# Patient Record
Sex: Female | Born: 1985 | Race: Black or African American | Hispanic: No | Marital: Single | Smoking: Current every day smoker
Health system: Southern US, Community
[De-identification: ages and names within clinical notes are randomized; demographics above are authoritative.]

## PROBLEM LIST (undated history)

## (undated) DIAGNOSIS — F319 Bipolar disorder, unspecified: Secondary | ICD-10-CM

## (undated) DIAGNOSIS — J45909 Unspecified asthma, uncomplicated: Secondary | ICD-10-CM

## (undated) DIAGNOSIS — T7840XA Allergy, unspecified, initial encounter: Secondary | ICD-10-CM

## (undated) HISTORY — DX: Allergy, unspecified, initial encounter: T78.40XA

## (undated) HISTORY — PX: DILATION AND CURETTAGE OF UTERUS: SHX78

## (undated) HISTORY — DX: Bipolar disorder, unspecified: F31.9

---

## 2009-08-27 ENCOUNTER — Emergency Department (HOSPITAL_COMMUNITY): Admission: EM | Admit: 2009-08-27 | Discharge: 2009-08-29 | Payer: Self-pay | Admitting: Emergency Medicine

## 2009-08-29 ENCOUNTER — Ambulatory Visit: Payer: Self-pay | Admitting: Psychiatry

## 2009-08-29 ENCOUNTER — Inpatient Hospital Stay (HOSPITAL_COMMUNITY): Admission: AD | Admit: 2009-08-29 | Discharge: 2009-09-02 | Payer: Self-pay | Admitting: Psychiatry

## 2009-10-23 ENCOUNTER — Emergency Department (HOSPITAL_COMMUNITY): Admission: EM | Admit: 2009-10-23 | Discharge: 2009-10-23 | Payer: Self-pay | Admitting: Emergency Medicine

## 2009-11-08 ENCOUNTER — Emergency Department (HOSPITAL_COMMUNITY): Admission: EM | Admit: 2009-11-08 | Discharge: 2009-11-08 | Payer: Self-pay | Admitting: Family Medicine

## 2010-08-20 IMAGING — CT CT HEAD W/O CM
1 series · 16 of 30 positions shown, 20 images · non-contrast
Comparison: None

CLINICAL DATA: Altered/ inappropriate behavior patterns increasing
over the past several months.

CT HEAD WITHOUT CONTRAST
TECHNIQUE: Contiguous axial images were obtained from the base of
the skull through the vertex without contrast.

[Series 2: headseq 4.8 h45s · axial · 0.43mm/px · z∈[-182,-53]mm · 16 of 30 slices shown, 20 images]
[im 2/30  brain]
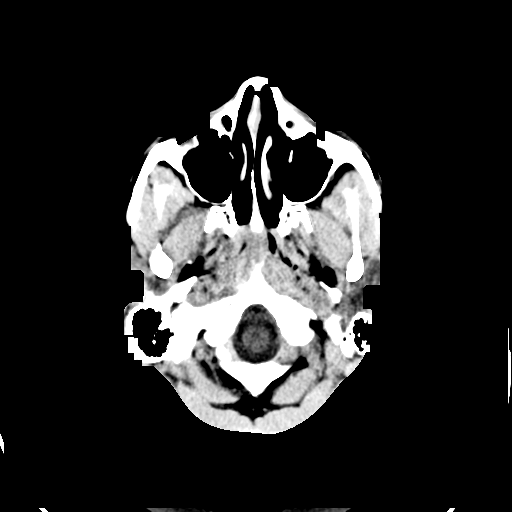
[im 2/30  bone]
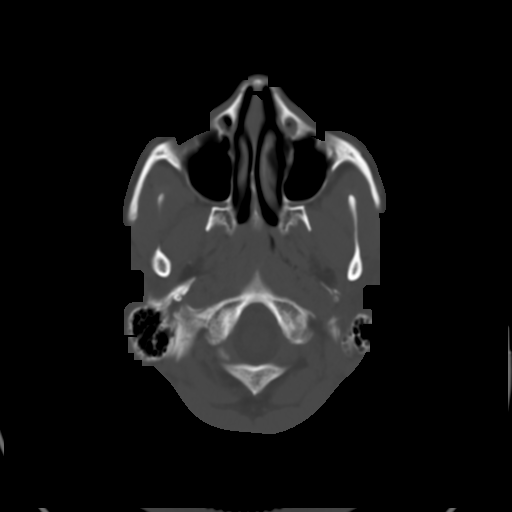
[im 4/30  brain]
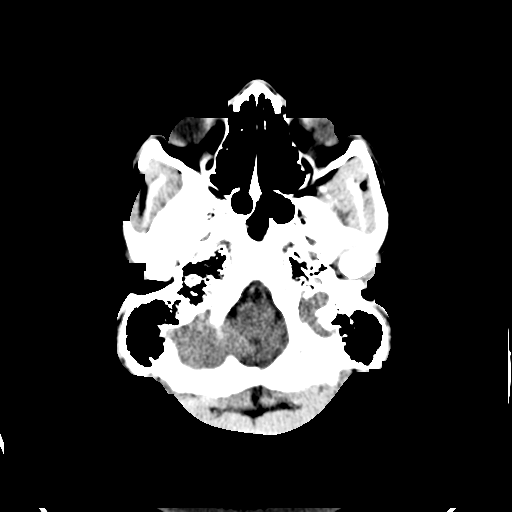
[im 6/30  brain]
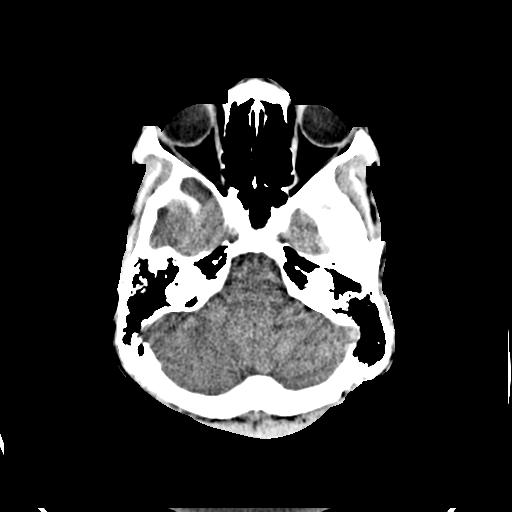
[im 8/30  brain]
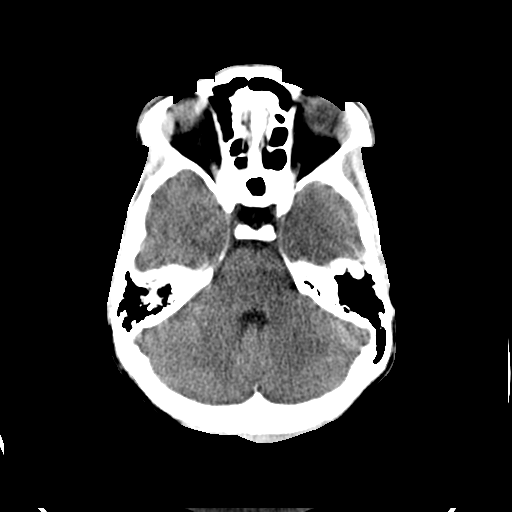
[im 9/30  brain]
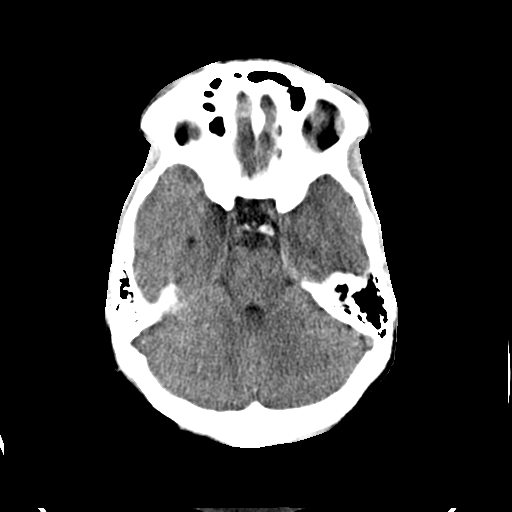
[im 9/30  bone]
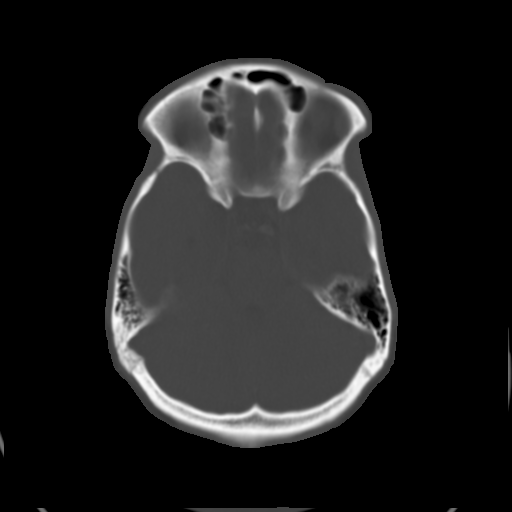
[im 11/30  brain]
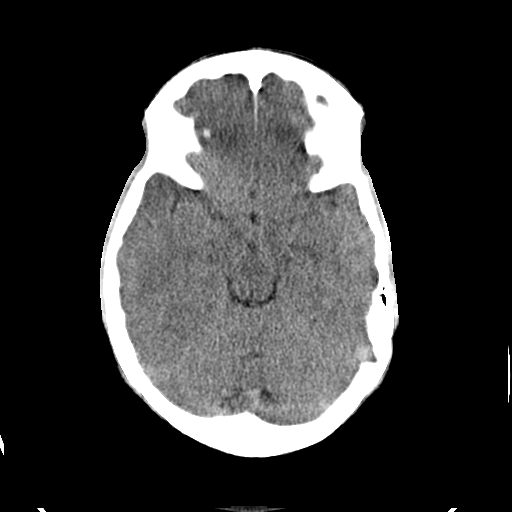
[im 13/30  brain]
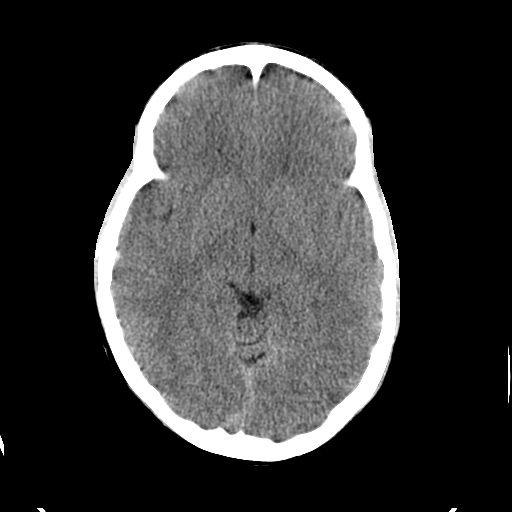
[im 15/30  brain]
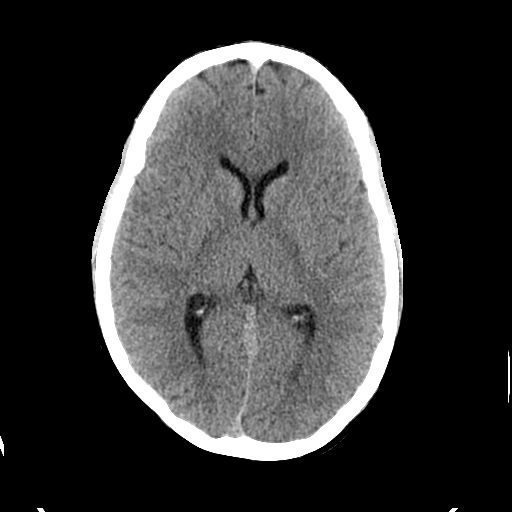
[im 16/30  brain]
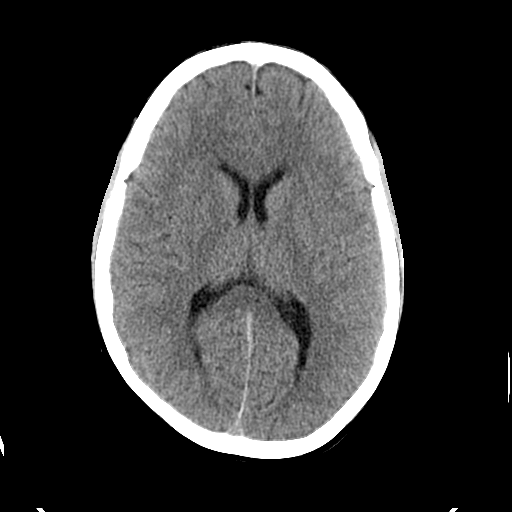
[im 16/30  bone]
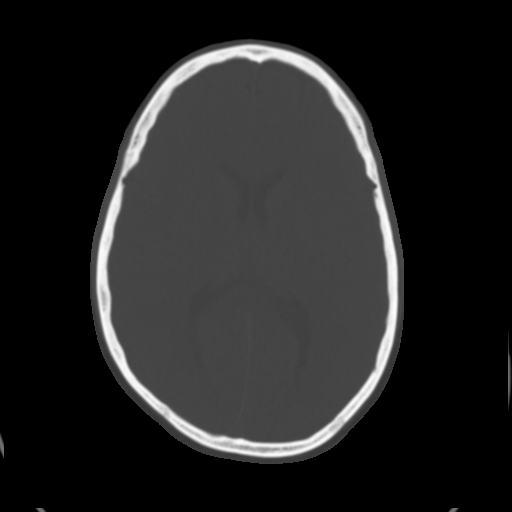
[im 18/30  brain]
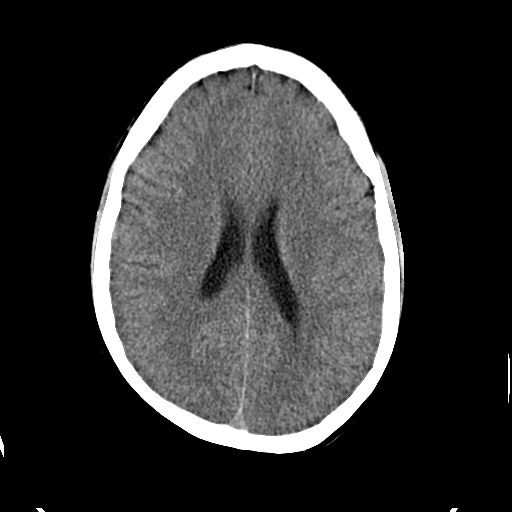
[im 20/30  brain]
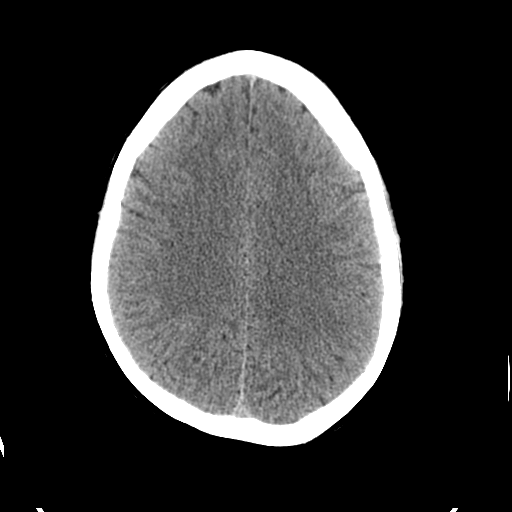
[im 22/30  brain]
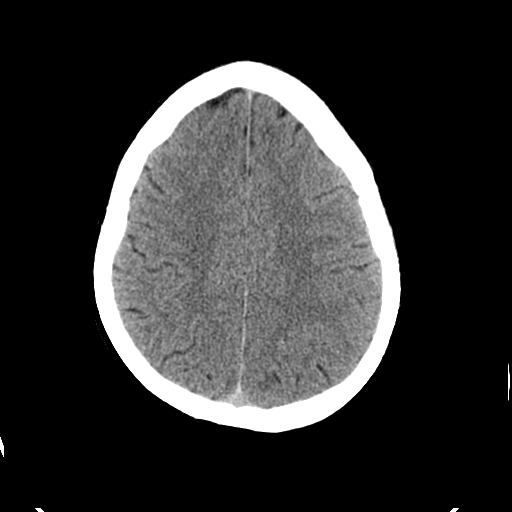
[im 23/30  brain]
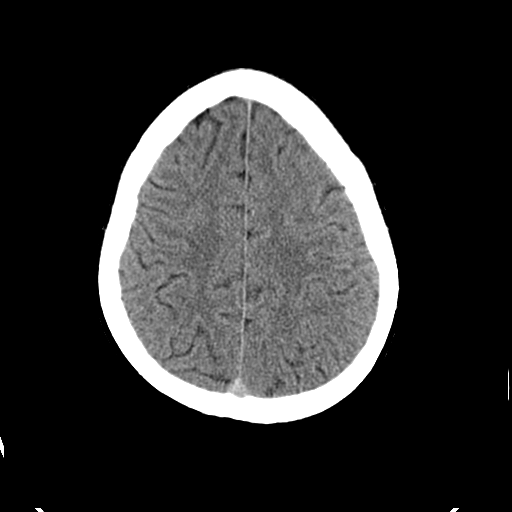
[im 23/30  bone]
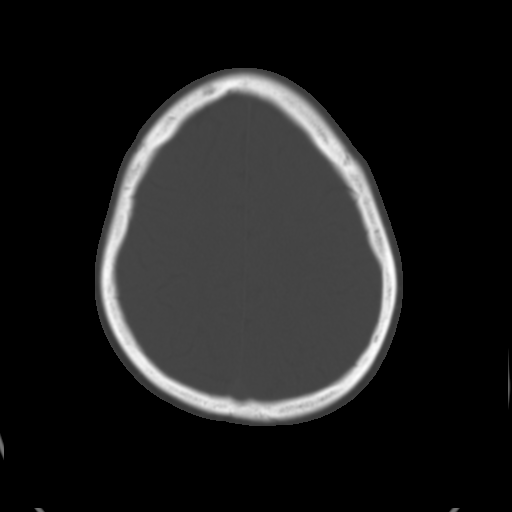
[im 25/30  brain]
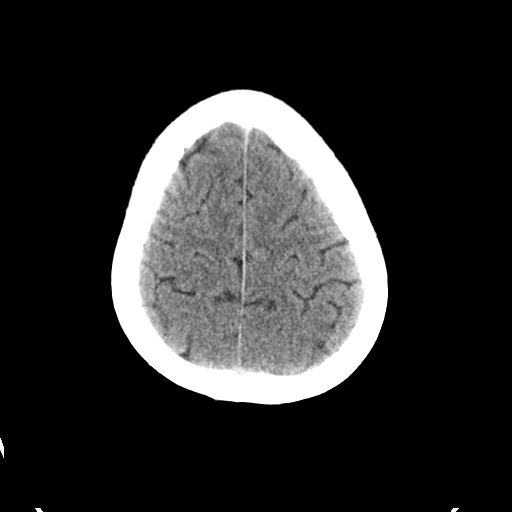
[im 27/30  brain]
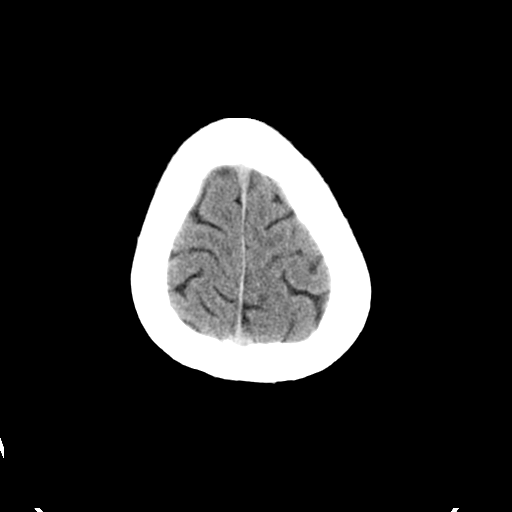
[im 29/30  brain]
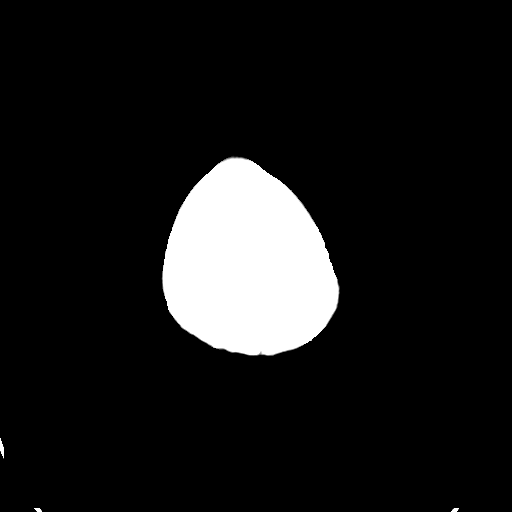

[16 of 30 positions shown; findings below may reference images not displayed]

FINDINGS: There is no intra or extra-axial fluid collection or mass
lesion.  The basilar cisterns and ventricles have a normal
appearance.  There is no CT evidence for acute infarction or
hemorrhage.

Bone windows show no acute findings
IMPRESSION: No evidence for acute intracranial abnormality.

## 2011-03-12 LAB — URINE CULTURE
Colony Count: NO GROWTH
Culture: NO GROWTH

## 2011-03-12 LAB — POCT URINALYSIS DIP (DEVICE)
Bilirubin Urine: NEGATIVE
Hgb urine dipstick: NEGATIVE
Ketones, ur: NEGATIVE mg/dL
Protein, ur: NEGATIVE mg/dL
pH: 7 (ref 5.0–8.0)

## 2011-03-12 LAB — GC/CHLAMYDIA PROBE AMP, GENITAL
Chlamydia, DNA Probe: NEGATIVE
GC Probe Amp, Genital: NEGATIVE

## 2011-03-12 LAB — WET PREP, GENITAL: Yeast Wet Prep HPF POC: NONE SEEN

## 2011-03-13 LAB — POCT URINALYSIS DIP (DEVICE)
Hgb urine dipstick: NEGATIVE
Protein, ur: NEGATIVE mg/dL
Specific Gravity, Urine: 1.02 (ref 1.005–1.030)
Urobilinogen, UA: 1 mg/dL (ref 0.0–1.0)
pH: 7 (ref 5.0–8.0)

## 2011-03-15 LAB — URINE MICROSCOPIC-ADD ON

## 2011-03-15 LAB — COMPREHENSIVE METABOLIC PANEL
AST: 18 U/L (ref 0–37)
Albumin: 3.9 g/dL (ref 3.5–5.2)
Alkaline Phosphatase: 57 U/L (ref 39–117)
BUN: 7 mg/dL (ref 6–23)
CO2: 27 mEq/L (ref 19–32)
Chloride: 107 mEq/L (ref 96–112)
GFR calc Af Amer: >60 mL/min (ref >60)
Potassium: 3.5 mEq/L (ref 3.5–5.1)
Total Bilirubin: 0.7 mg/dL (ref 0.3–1.2)

## 2011-03-15 LAB — DIFFERENTIAL
Basophils Absolute: 0 10*3/uL (ref 0.0–0.1)
Basophils Relative: 0 % (ref 0–1)
Eosinophils Relative: 1 % (ref 0–5)
Monocytes Absolute: 0.6 10*3/uL (ref 0.1–1.0)

## 2011-03-15 LAB — URINALYSIS, ROUTINE W REFLEX MICROSCOPIC
Glucose, UA: NEGATIVE mg/dL
Hgb urine dipstick: NEGATIVE
Specific Gravity, Urine: 1.025 (ref 1.005–1.030)
Urobilinogen, UA: 1 mg/dL (ref 0.0–1.0)
pH: 6 (ref 5.0–8.0)

## 2011-03-15 LAB — RAPID URINE DRUG SCREEN, HOSP PERFORMED: Benzodiazepines: NOT DETECTED

## 2011-03-15 LAB — CBC
HCT: 41.5 % (ref 36.0–46.0)
Platelets: 234 10*3/uL (ref 150–400)
RBC: 4.73 MIL/uL (ref 3.87–5.11)
WBC: 7.1 10*3/uL (ref 4.0–10.5)

## 2011-03-15 LAB — ETHANOL: Alcohol, Ethyl (B): <5 mg/dL (ref 0–10)

## 2013-06-22 ENCOUNTER — Ambulatory Visit (INDEPENDENT_AMBULATORY_CARE_PROVIDER_SITE_OTHER): Payer: BC Managed Care – PPO | Admitting: Internal Medicine

## 2013-06-22 VITALS — BP 112/70 | HR 71 | Temp 97.8°F | Resp 18 | Ht 61.5 in | Wt 91.0 lb

## 2013-06-22 DIAGNOSIS — A599 Trichomoniasis, unspecified: Secondary | ICD-10-CM

## 2013-06-22 DIAGNOSIS — Z0000 Encounter for general adult medical examination without abnormal findings: Secondary | ICD-10-CM

## 2013-06-22 DIAGNOSIS — Z309 Encounter for contraceptive management, unspecified: Secondary | ICD-10-CM

## 2013-06-22 DIAGNOSIS — Z2089 Contact with and (suspected) exposure to other communicable diseases: Secondary | ICD-10-CM

## 2013-06-22 DIAGNOSIS — Z202 Contact with and (suspected) exposure to infections with a predominantly sexual mode of transmission: Secondary | ICD-10-CM

## 2013-06-22 LAB — POCT UA - MICROSCOPIC ONLY
Bacteria, U Microscopic: NEGATIVE
Casts, Ur, LPF, POC: NEGATIVE
Yeast, UA: NEGATIVE

## 2013-06-22 LAB — POCT URINALYSIS DIPSTICK
Bilirubin, UA: NEGATIVE
Blood, UA: NEGATIVE
Ketones, UA: NEGATIVE
Spec Grav, UA: 1.02
pH, UA: 8

## 2013-06-22 LAB — POCT WET PREP WITH KOH: Trichomonas, UA: POSITIVE

## 2013-06-22 MED ORDER — METRONIDAZOLE 500 MG PO TABS: 500.0000 mg | ORAL_TABLET | Freq: Two times a day (BID) | ORAL | Status: DC

## 2013-06-22 MED ORDER — ETONOGESTREL-ETHINYL ESTRADIOL 0.12-0.015 MG/24HR VA RING: VAGINAL_RING | VAGINAL | Status: DC

## 2013-06-22 NOTE — Progress Notes (Signed)
Subjective:    Patient ID: Melinda Baker, female    DOB: [birth year], 27 y.o.   MRN: [id number]  HPI Melinda Baker is a 27 y.o. female presenting for CPE.    Her main concern today is  STD testing.  She received a phone call from a previous sexual partner that after having a sexual encounter around [place], he had to be treated for 7 days with an anti-biotics.  She has had 5 partners since then.  Intermittent use of condoms.  Describes vaginal d/c being "creamier" but not a change in volume.  Had a cyst several months ago in her groin area but it went away on it's own.  Denies dysuria, urinary frequency.  Hx of D&C.  She is now interested in birth control.  Has used pill and nuvaring in the past with no complications.    She was diagnosed with Bipolar Disorder 3-4 years ago and placed in rehab.  She took a medication for two years.  Did not feel like herself.  Self-discontinued the medication.  Has not had any episodes since stopping the medication.  She describes her mood as normal.  Mentioned she occasionally gets upset when someone "disrespects" her.    She has a history of drug use - ectasy, marijuana.  No drug use at this point.  Denies alcohol use.  Smokes 0.5 pack a day.  Started using e-cig and hopes that will help her quit.   Past medical history, surgical history, medications, allergies, social history and family history have been reviewed.  Review of Systems As stated in HPI - otherwise negative.    Objective:   Physical Exam Filed Vitals:   06/22/13 1439  BP: 112/70  Pulse: 71  Temp: 97.8 F (36.6 C)  TempSrc: Oral  Resp: 18  Height: 5' 1.5" (1.562 m)  Weight: 91 lb (41.277 kg)  SpO2: 100%   General:  WDWN female in no acute distress. Skin:  Soft, warm skin with good turgor.  No bruising or lesions present.  HEENT:   Head - normocephalic, no visible or palpable masses.  Eyes - conjunctivae clear, sclera white, PERRL.    Ears - EACs clear.  TMs are translucent  and mobile.  Hearing intact.   Nose - Patent.  Nasal mucosa is non-inflamed.  Septum midline.   Mouth/Throat - Mucous membranes are moist and pink.  No obvious periodontal disease.   No tonsillar hypertrophy or exudate.   Neck - supple without lymphadenopathy or thyromegaly . Cardiovascular: S1 and S2 distinct with no murmurs, rubs or gallops. Respiratory:  CTA with no adventitious sounds. Abdominal:  Soft, non-tender abdomen with no visible pulsations or masses.  Bowel sounds adequate.   MSK:  Full range of motion with no pain. Neuro:  Normal gait.  Cranial nerves grossly intact.  Sensation intact.  Reflexes 2+ throughout upper and lower extremities. Pelvic:  Vagina and cervix are without lesions. Copious white discharge present.  Uterus and adnexa are non-tender without masses.  Results for orders placed in visit on 06/22/13  POCT URINALYSIS DIPSTICK      Result Value Range   Color, UA yellow     Clarity, UA clear     Glucose, UA neg     Bilirubin, UA neg     Ketones, UA neg     Spec Grav, UA 1.020     Blood, UA neg     pH, UA 8.0     Protein, UA neg  Urobilinogen, UA 1.0     Nitrite, UA neg     Leukocytes, UA Trace    POCT UA - MICROSCOPIC ONLY      Result Value Range   WBC, Ur, HPF, POC 15-25     RBC, urine, microscopic 0-1     Bacteria, U Microscopic neg     Mucus, UA neg     Epithelial cells, urine per micros 10-20     Crystals, Ur, HPF, POC neg     Casts, Ur, LPF, POC neg     Yeast, UA neg    POCT WET PREP WITH KOH      Result Value Range   Trichomonas, UA Positive     Clue Cells Wet Prep HPF POC 2-5     Epithelial Wet Prep HPF POC 10-15     Yeast Wet Prep HPF POC neg     Bacteria Wet Prep HPF POC 3+     RBC Wet Prep HPF POC 5-10     WBC Wet Prep HPF POC 4-6     KOH Prep POC Negative        Assessment & Plan:  1. Annual physical exam Given her history of Bipolar Disorder, rehab and that she is currently un-medicated.  Recommended RTC in 2 months with Dr.  Merla Riches at 104 to establish care and for pych evaluation.  Patient was receptive.  She will call to make the appointment.  Ordered:   - HIV antibody - RPR - POCT urinalysis dipstick - CBC with Differential - Pap IG, CT/NG w/ reflex HPV when ASC-U - POCT UA - Microscopic Only - POCT Wet Prep with KOH  2. Trichimoniasis Positive on urine and wet prep.  Treat with Flagyl.  Patient education on safe sex practices and regular condom use.     Ordered: - metroNIDAZOLE (FLAGYL) 500 MG tablet; Take 1 tablet (500 mg total) by mouth 2 (two) times daily before a meal.  Dispense: 14 tablet; Refill: 0  3. Unspecified contraceptive management Patient interested in birth control.  Preferred nuvaring over pill.  Will call or use mychart if too expensive.    Ordered:   - etonogestrel-ethinyl estradiol (NUVARING) 0.12-0.015 MG/24HR vaginal ring; Insert vaginally and leave in place for 3 consecutive weeks, then remove for 1 week.  Dispense: 1 each; Refill: 12  4. Exposure to STD-risky behavior Full STD testing performed.    I participated fully in encounter I have reviewed and agree with documentation. Robert P. Merla Riches, M.D.

## 2013-06-23 LAB — CBC WITH DIFFERENTIAL/PLATELET
Basophils Relative: 0 % (ref 0–1)
Eosinophils Absolute: 0.1 10*3/uL (ref 0.0–0.7)
HCT: 46.6 % — ABNORMAL HIGH (ref 36.0–46.0)
Hemoglobin: 15.8 g/dL — ABNORMAL HIGH (ref 12.0–15.0)
MCH: 29.5 pg (ref 26.0–34.0)
MCHC: 33.9 g/dL (ref 30.0–36.0)
Monocytes Absolute: 0.5 10*3/uL (ref 0.1–1.0)
Monocytes Relative: 6 % (ref 3–12)
Neutro Abs: 4.7 10*3/uL (ref 1.7–7.7)

## 2013-06-23 LAB — PAP IG, CT-NG, RFX HPV ASCU
Chlamydia Probe Amp: NEGATIVE
GC Probe Amp: NEGATIVE

## 2013-12-09 NOTE — L&D Delivery Note (Signed)
Patient was C/C/+2 and pushed for 30 minutes with epidural.   NSVD female infant, Apgars 9/9, weight pending.   The patient had no lacerations. Fundus was firm. EBL was expected. Placenta was delivered intact. Vagina was clear.  Baby was vigorous and doing skin to skin with mother.  Philip AspenALLAHAN, Leontyne Manville

## 2013-12-20 ENCOUNTER — Other Ambulatory Visit: Payer: Self-pay | Admitting: Internal Medicine

## 2014-05-30 ENCOUNTER — Encounter: Payer: Self-pay | Admitting: Obstetrics

## 2014-06-22 LAB — OB RESULTS CONSOLE GC/CHLAMYDIA: Chlamydia: NEGATIVE

## 2014-08-01 LAB — OB RESULTS CONSOLE RUBELLA ANTIBODY, IGM: Rubella: IMMUNE

## 2014-08-01 LAB — OB RESULTS CONSOLE ANTIBODY SCREEN: Antibody Screen: NEGATIVE

## 2014-08-01 LAB — OB RESULTS CONSOLE HEPATITIS B SURFACE ANTIGEN: Hepatitis B Surface Ag: NEGATIVE

## 2014-08-01 LAB — OB RESULTS CONSOLE GC/CHLAMYDIA
Chlamydia: POSITIVE
Gonorrhea: NEGATIVE

## 2014-08-01 LAB — OB RESULTS CONSOLE ABO/RH: RH TYPE: POSITIVE

## 2014-08-01 LAB — OB RESULTS CONSOLE HIV ANTIBODY (ROUTINE TESTING): HIV: NONREACTIVE

## 2014-09-19 ENCOUNTER — Encounter (HOSPITAL_COMMUNITY): Payer: Medicaid Other | Admitting: Anesthesiology

## 2014-09-19 ENCOUNTER — Inpatient Hospital Stay (HOSPITAL_COMMUNITY): Payer: Medicaid Other | Admitting: Anesthesiology

## 2014-09-19 ENCOUNTER — Encounter (HOSPITAL_COMMUNITY): Payer: Self-pay | Admitting: *Deleted

## 2014-09-19 ENCOUNTER — Inpatient Hospital Stay (HOSPITAL_COMMUNITY)
Admission: AD | Admit: 2014-09-19 | Discharge: 2014-09-21 | DRG: 775 | Disposition: A | Discharge status: Home / Self Care | Payer: Medicaid Other | Source: Ambulatory Visit | Attending: Obstetrics and Gynecology | Admitting: Obstetrics and Gynecology

## 2014-09-19 DIAGNOSIS — O99343 Other mental disorders complicating pregnancy, third trimester: Secondary | ICD-10-CM | POA: Diagnosis present

## 2014-09-19 DIAGNOSIS — Z3483 Encounter for supervision of other normal pregnancy, third trimester: Secondary | ICD-10-CM | POA: Diagnosis present

## 2014-09-19 DIAGNOSIS — Z3A37 37 weeks gestation of pregnancy: Secondary | ICD-10-CM | POA: Diagnosis present

## 2014-09-19 DIAGNOSIS — O133 Gestational [pregnancy-induced] hypertension without significant proteinuria, third trimester: Secondary | ICD-10-CM

## 2014-09-19 DIAGNOSIS — O99334 Smoking (tobacco) complicating childbirth: Secondary | ICD-10-CM | POA: Diagnosis present

## 2014-09-19 DIAGNOSIS — O0933 Supervision of pregnancy with insufficient antenatal care, third trimester: Secondary | ICD-10-CM

## 2014-09-19 DIAGNOSIS — F319 Bipolar disorder, unspecified: Secondary | ICD-10-CM | POA: Diagnosis present

## 2014-09-19 HISTORY — DX: Unspecified asthma, uncomplicated: J45.909

## 2014-09-19 LAB — CBC
HCT: 44.1 % (ref 36.0–46.0)
Hemoglobin: 15.5 g/dL — ABNORMAL HIGH (ref 12.0–15.0)
MCH: 29.8 pg (ref 26.0–34.0)
MCHC: 35.1 g/dL (ref 30.0–36.0)
MCV: 84.6 fL (ref 78.0–100.0)
PLATELETS: 235 10*3/uL (ref 150–400)
RBC: 5.21 MIL/uL — ABNORMAL HIGH (ref 3.87–5.11)
RDW: 13.4 % (ref 11.5–15.5)
WBC: 8.2 10*3/uL (ref 4.0–10.5)

## 2014-09-19 LAB — GROUP B STREP BY PCR: Group B strep by PCR: POSITIVE — AB

## 2014-09-19 LAB — OB RESULTS CONSOLE RPR: RPR: NONREACTIVE

## 2014-09-19 LAB — POCT FERN TEST: POCT FERN TEST: POSITIVE

## 2014-09-19 LAB — OB RESULTS CONSOLE GBS: STREP GROUP B AG: POSITIVE

## 2014-09-19 LAB — ABO/RH: ABO/RH(D): O POS

## 2014-09-19 LAB — RPR

## 2014-09-19 MED ORDER — SIMETHICONE 80 MG PO CHEW: 80.0000 mg | CHEWABLE_TABLET | ORAL | Status: DC | PRN

## 2014-09-19 MED ORDER — LACTATED RINGERS IV SOLN: 500.0000 mL | Freq: Once | INTRAVENOUS | Status: DC

## 2014-09-19 MED ORDER — EPHEDRINE 5 MG/ML INJ
10.0000 mg | INTRAVENOUS | Status: DC | PRN
  Filled 2014-09-19: qty 2

## 2014-09-19 MED ORDER — PENICILLIN G POTASSIUM 5000000 UNITS IJ SOLR
2.5000 10*6.[IU] | INTRAVENOUS | Status: DC
  Filled 2014-09-19 (×3): qty 2.5

## 2014-09-19 MED ORDER — ACETAMINOPHEN 325 MG PO TABS: 650.0000 mg | ORAL_TABLET | ORAL | Status: DC | PRN

## 2014-09-19 MED ORDER — OXYTOCIN 40 UNITS IN LACTATED RINGERS INFUSION - SIMPLE MED
62.5000 mL/h | INTRAVENOUS | Status: DC
  Administered 2014-09-19: 62.5 mL/h via INTRAVENOUS
  Filled 2014-09-19: qty 1000

## 2014-09-19 MED ORDER — FENTANYL 2.5 MCG/ML BUPIVACAINE 1/10 % EPIDURAL INFUSION (WH - ANES)
14.0000 mL/h | INTRAMUSCULAR | Status: DC | PRN
  Administered 2014-09-19: 14 mL/h via EPIDURAL
  Filled 2014-09-19: qty 125

## 2014-09-19 MED ORDER — ONDANSETRON HCL 4 MG/2ML IJ SOLN: 4.0000 mg | INTRAMUSCULAR | Status: DC | PRN

## 2014-09-19 MED ORDER — ONDANSETRON HCL 4 MG PO TABS
4.0000 mg | ORAL_TABLET | ORAL | Status: DC | PRN
Start: 2014-09-19 — End: 2014-09-21

## 2014-09-19 MED ORDER — IBUPROFEN 600 MG PO TABS
600.0000 mg | ORAL_TABLET | Freq: Four times a day (QID) | ORAL | Status: DC
  Administered 2014-09-20 – 2014-09-21 (×7): 600 mg via ORAL
  Filled 2014-09-19 (×7): qty 1

## 2014-09-19 MED ORDER — OXYCODONE-ACETAMINOPHEN 5-325 MG PO TABS: 2.0000 | ORAL_TABLET | ORAL | Status: DC | PRN

## 2014-09-19 MED ORDER — PRENATAL MULTIVITAMIN CH
1.0000 | ORAL_TABLET | Freq: Every day | ORAL | Status: DC
  Administered 2014-09-20 – 2014-09-21 (×2): 1 via ORAL
  Filled 2014-09-19 (×2): qty 1

## 2014-09-19 MED ORDER — OXYCODONE-ACETAMINOPHEN 5-325 MG PO TABS
1.0000 | ORAL_TABLET | ORAL | Status: DC | PRN
  Administered 2014-09-21: 1 via ORAL
  Filled 2014-09-19: qty 1

## 2014-09-19 MED ORDER — LIDOCAINE HCL (PF) 1 % IJ SOLN
INTRAMUSCULAR | Status: DC | PRN
  Administered 2014-09-19 (×2): 5 mL

## 2014-09-19 MED ORDER — BENZOCAINE-MENTHOL 20-0.5 % EX AERO
1.0000 "application " | INHALATION_SPRAY | CUTANEOUS | Status: DC | PRN
  Administered 2014-09-20: 1 via TOPICAL
  Filled 2014-09-19: qty 56

## 2014-09-19 MED ORDER — ZOLPIDEM TARTRATE 5 MG PO TABS: 5.0000 mg | ORAL_TABLET | Freq: Every evening | ORAL | Status: DC | PRN

## 2014-09-19 MED ORDER — ONDANSETRON HCL 4 MG/2ML IJ SOLN
4.0000 mg | Freq: Four times a day (QID) | INTRAMUSCULAR | Status: DC | PRN
  Administered 2014-09-19: 4 mg via INTRAVENOUS
  Filled 2014-09-19: qty 2

## 2014-09-19 MED ORDER — PHENYLEPHRINE 40 MCG/ML (10ML) SYRINGE FOR IV PUSH (FOR BLOOD PRESSURE SUPPORT)
80.0000 ug | PREFILLED_SYRINGE | INTRAVENOUS | Status: DC | PRN
  Filled 2014-09-19: qty 2
  Filled 2014-09-19: qty 10

## 2014-09-19 MED ORDER — LANOLIN HYDROUS EX OINT: TOPICAL_OINTMENT | CUTANEOUS | Status: DC | PRN

## 2014-09-19 MED ORDER — SENNOSIDES-DOCUSATE SODIUM 8.6-50 MG PO TABS
2.0000 | ORAL_TABLET | ORAL | Status: DC
  Administered 2014-09-20 – 2014-09-21 (×2): 2 via ORAL
  Filled 2014-09-19 (×2): qty 2

## 2014-09-19 MED ORDER — DIPHENHYDRAMINE HCL 50 MG/ML IJ SOLN: 12.5000 mg | INTRAMUSCULAR | Status: DC | PRN

## 2014-09-19 MED ORDER — TETANUS-DIPHTH-ACELL PERTUSSIS 5-2.5-18.5 LF-MCG/0.5 IM SUSP
0.5000 mL | Freq: Once | INTRAMUSCULAR | Status: AC
  Administered 2014-09-21: 0.5 mL via INTRAMUSCULAR
  Filled 2014-09-19: qty 0.5

## 2014-09-19 MED ORDER — OXYTOCIN BOLUS FROM INFUSION
500.0000 mL | INTRAVENOUS | Status: DC
  Administered 2014-09-19: 500 mL via INTRAVENOUS

## 2014-09-19 MED ORDER — DIPHENHYDRAMINE HCL 25 MG PO CAPS
25.0000 mg | ORAL_CAPSULE | Freq: Four times a day (QID) | ORAL | Status: DC | PRN
Start: 2014-09-19 — End: 2014-09-21

## 2014-09-19 MED ORDER — LACTATED RINGERS IV SOLN
INTRAVENOUS | Status: DC
  Administered 2014-09-19: 12:00:00 via INTRAVENOUS

## 2014-09-19 MED ORDER — LIDOCAINE HCL (PF) 1 % IJ SOLN
30.0000 mL | INTRAMUSCULAR | Status: DC | PRN
  Filled 2014-09-19: qty 30

## 2014-09-19 MED ORDER — OXYCODONE-ACETAMINOPHEN 5-325 MG PO TABS: 1.0000 | ORAL_TABLET | ORAL | Status: DC | PRN

## 2014-09-19 MED ORDER — FLEET ENEMA 7-19 GM/118ML RE ENEM
1.0000 | ENEMA | RECTAL | Status: DC | PRN
Start: 2014-09-19 — End: 2014-09-19

## 2014-09-19 MED ORDER — PHENYLEPHRINE 40 MCG/ML (10ML) SYRINGE FOR IV PUSH (FOR BLOOD PRESSURE SUPPORT)
80.0000 ug | PREFILLED_SYRINGE | INTRAVENOUS | Status: DC | PRN
  Administered 2014-09-19: 80 ug via INTRAVENOUS
  Filled 2014-09-19: qty 2

## 2014-09-19 MED ORDER — PENICILLIN G POTASSIUM 5000000 UNITS IJ SOLR
5.0000 10*6.[IU] | Freq: Once | INTRAVENOUS | Status: AC
  Administered 2014-09-19: 5 10*6.[IU] via INTRAVENOUS
  Filled 2014-09-19: qty 5

## 2014-09-19 MED ORDER — LACTATED RINGERS IV SOLN: 500.0000 mL | INTRAVENOUS | Status: DC | PRN

## 2014-09-19 MED ORDER — CITRIC ACID-SODIUM CITRATE 334-500 MG/5ML PO SOLN
30.0000 mL | ORAL | Status: DC | PRN
  Filled 2014-09-19: qty 15

## 2014-09-19 MED ORDER — DIBUCAINE 1 % RE OINT: 1.0000 "application " | TOPICAL_OINTMENT | RECTAL | Status: DC | PRN

## 2014-09-19 MED ORDER — WITCH HAZEL-GLYCERIN EX PADS: 1.0000 "application " | MEDICATED_PAD | CUTANEOUS | Status: DC | PRN

## 2014-09-19 NOTE — Progress Notes (Signed)
Patient ID: Melinda Baker, female   DOB: [birth year], 28 y.o.   MRN: [id number]  Asked to see patient with decel post epidural x 10 mins. VE per RN is 4-5 with head low. FSE placed by RN. Placed in knee chest with rapid improvement in FHR and return to baseline.

## 2014-09-19 NOTE — H&P (Addendum)
28 y.o. 1166w1d  G2P0010 comes in c/o SROM 8am.  Otherwise has good fetal movement and no bleeding.  Planning on giving baby up for adoption  Past Medical History  Diagnosis Date  . Allergy   . Bipolar 1 disorder   . Asthma     Past Surgical History  Procedure Laterality Date  . Dilation and curettage of uterus      OB History  Gravida Para Term Preterm AB SAB TAB Ectopic Multiple Living  2    1         # Outcome Date GA Lbr Len/2nd Weight Sex Delivery Anes PTL Lv  2 CUR           1 ABT               History   Social History  . Marital Status: Single    Spouse Name: N/A    Number of Children: N/A  . Years of Education: N/A   Occupational History  .      server, [employer]   Social History Main Topics  . Smoking status: Current Every Day Smoker -- 0.50 packs/day for 5 years  . Smokeless tobacco: Never Used     Comment: e-cig, trying to "do better"  . Alcohol Use: Yes     Comment: 3-4x per week, 2-3 per day - doing better   . Drug Use: No     Comment: hx of ectasy, marijuana use   . Sexual Activity: Yes    Partners: Male    Birth Control/ Protection: Condom   Other Topics Concern  . Not on file   Social History Narrative   [place]   Review of patient's allergies indicates no known allergies.    Prenatal Transfer Tool  Maternal Diabetes: No Genetic Screening: Declined Maternal Ultrasounds/Referrals: Normal, EFW 25% at 34weeks Fetal Ultrasounds or other Referrals:  None Maternal Substance Abuse:  No Significant Maternal Medications:  None Significant Maternal Lab Results: Lab values include: Other:  +CT in pregnancy, treated.  GBS not collected.  Other PNC: Late entry to prenatal care at 29weeks, bipolar disorder on no meds   Filed Vitals:   09/19/14 1131  BP: 130/84  Pulse: 66  Temp: 97.6 F (36.4 C)  Resp: 18     Lungs/Cor:  NAD Abdomen:  soft, gravid Ex:  no cords, erythema SVE:  4/70/-2 FHTs:  120 good STV, NST  R Toco:  q 4-6   A/P   Admit to L&D  Epidural being placed now  GBS not done, rapid GBS ordered, result pending  If not cervical change in 2 hrs, will start pitocin  Social work consult placed, requested pre-delivery  [place] City[place]AN, Luther ParodySIDNEY

## 2014-09-19 NOTE — Anesthesia Procedure Notes (Signed)
Epidural Patient location during procedure: OB Start time: 09/19/2014 1:27 PM  Staffing Anesthesiologist: Brayton CavesJACKSON, Krisa Blattner Performed by: anesthesiologist   Preanesthetic Checklist Completed: patient identified, site marked, surgical consent, pre-op evaluation, timeout performed, IV checked, risks and benefits discussed and monitors and equipment checked  Epidural Patient position: sitting Prep: site prepped and draped and DuraPrep Patient monitoring: continuous pulse ox and blood pressure Approach: midline Location: L3-L4 Injection technique: LOR air  Needle:  Needle type: Tuohy  Needle gauge: 17 G Needle length: 9 cm and 9 Needle insertion depth: 4 cm Catheter type: closed end flexible Catheter size: 19 Gauge Catheter at skin depth: 10 cm Test dose: negative  Assessment Events: blood not aspirated, injection not painful, no injection resistance, negative IV test and no paresthesia  Additional Notes Patient identified.  Risk benefits discussed including failed block, incomplete pain control, headache, nerve damage, paralysis, blood pressure changes, nausea, vomiting, reactions to medication both toxic or allergic, and postpartum back pain.  Patient expressed understanding and wished to proceed.  All questions were answered.  Sterile technique used throughout procedure and epidural site dressed with sterile barrier dressing. No paresthesia or other complications noted.The patient did not experience any signs of intravascular injection such as tinnitus or metallic taste in mouth nor signs of intrathecal spread such as rapid motor block. Please see nursing notes for vital signs.

## 2014-09-19 NOTE — Progress Notes (Signed)
CSW met with the patient due to patient verbalizing desire to place the baby up for the adoption. CSW met with the patient and her mother (with the patient's consent) to begin to assist her process her thoughts and feelings related to the adoption, and her level of confidence in her desire to place the baby up for adoption. CSW offered emotional support as the patient continues to consider the adoption, and encouraged the patient to make the decision that is best for her.  Patient stated that she does not want an adoption agency contacted at this time.  She voiced preference to meet with the CSW in the morning (10/13) to further discuss her thoughts and feelings prior to [facility] making contact with an agency. The patient became appropriately tearful as CSW explored her thoughts and feelings.   CSW to continue to follow and will follow-up on 10/13.

## 2014-09-19 NOTE — MAU Note (Signed)
Patient presents to MAU with c/o leaking of clear fluid since 8am this morning. Reports frequent contractions. Denies Vaginal bleeding. +FM

## 2014-09-19 NOTE — Anesthesia Preprocedure Evaluation (Addendum)
Anesthesia Evaluation  Patient identified by MRN, date of birth, ID band Patient awake    Reviewed: Allergy & Precautions, H&P , Patient's Chart, lab work & pertinent test results  Airway Mallampati: II TM Distance: >3 FB Neck ROM: full    Dental   Pulmonary asthma , Current Smoker,  breath sounds clear to auscultation        Cardiovascular Rhythm:regular Rate:Normal     Neuro/Psych    GI/Hepatic   Endo/Other    Renal/GU      Musculoskeletal   Abdominal   Peds  Hematology   Anesthesia Other Findings   Reproductive/Obstetrics (+) Pregnancy                          Anesthesia Physical Anesthesia Plan  ASA: II  Anesthesia Plan: Epidural   Post-op Pain Management:    Induction:   Airway Management Planned:   Additional Equipment:   Intra-op Plan:   Post-operative Plan:   Informed Consent: I have reviewed the patients History and Physical, chart, labs and discussed the procedure including the risks, benefits and alternatives for the proposed anesthesia with the patient or authorized representative who has indicated his/her understanding and acceptance.     Plan Discussed with:   Anesthesia Plan Comments:         Anesthesia Quick Evaluation

## 2014-09-19 NOTE — Plan of Care (Signed)
Problem: Consults Goal: Birthing Suites Patient Information Press F2 to bring up selections list  Outcome: Completed/Met Date Met:  09/19/14  Pt 37-[redacted] weeks EGA Goal: Chaplain Consult Outcome: Not Met (add Reason) Will monitor patient to assess if Chaplain is needed    Goal: Haematologist (Birthing Suites) Outcome: Completed/Met Date Met:  09/19/14 Patient planning to put baby up for adoption; social work consulted.

## 2014-09-20 LAB — CBC
HCT: 40.6 % (ref 36.0–46.0)
Hemoglobin: 13.8 g/dL (ref 12.0–15.0)
MCH: 29.6 pg (ref 26.0–34.0)
MCHC: 34 g/dL (ref 30.0–36.0)
MCV: 86.9 fL (ref 78.0–100.0)
Platelets: 216 10*3/uL (ref 150–400)
RBC: 4.67 MIL/uL (ref 3.87–5.11)
RDW: 13.5 % (ref 11.5–15.5)
WBC: 10.6 10*3/uL — AB (ref 4.0–10.5)

## 2014-09-20 NOTE — Anesthesia Postprocedure Evaluation (Signed)
  Anesthesia Post-op Note  Patient: Melinda Baker  Procedure(s) Performed: * No procedures listed *  Patient Location: Mother/Baby  Anesthesia Type:Epidural  Level of Consciousness: awake, alert , oriented and patient cooperative  Airway and Oxygen Therapy: Patient Spontanous Breathing  Post-op Pain: mild  Post-op Assessment: Post-op Vital signs reviewed, Patient's Cardiovascular Status Stable, Respiratory Function Stable, Patent Airway, No headache, No backache, No residual numbness and No residual motor weakness  Post-op Vital Signs: Reviewed and stable  Last Vitals:  Filed Vitals:   09/20/14 0545  BP: 103/68  Pulse: 61  Temp: 36.8 C  Resp: 18    Complications: No apparent anesthesia complications

## 2014-09-20 NOTE — Discharge Summary (Signed)
Obstetric Discharge Summary Reason for Admission: onset of labor Prenatal Procedures: none Intrapartum Procedures: spontaneous vaginal delivery Postpartum Procedures: none Complications-Operative and Postpartum: none Hemoglobin  Date Value Ref Range Status  09/20/2014 13.8  12.0 - 15.0 g/dL Final     HCT  Date Value Ref Range Status  09/20/2014 40.6  36.0 - 46.0 % Final    Discharge Diagnoses: Term Pregnancy-delivered  Discharge Information: Date: 09/20/2014 Activity: pelvic rest Diet: routine Medications: Ibuprofen Condition: stable Instructions: refer to practice specific booklet Discharge to: home Follow-up Information   Follow up with CALLAHAN, SIDNEY, DO In 4 weeks.   Specialty:  Obstetrics and Gynecology   Contact information:   [address] [place] [address] [phone]       Newborn Data: Live born female  Birth Weight: 4 lb 6.4 oz (1995 g) APGAR: 9, 9  Home with mother.  Tlaloc Taddei A 09/20/2014, 8:11 AM

## 2014-09-20 NOTE — Progress Notes (Signed)
Patient is eating, ambulating, voiding.  Pain control is good.  Filed Vitals:   09/19/14 1820 09/19/14 1935 09/20/14 0007 09/20/14 0545  BP: 135/47 127/65 122/65 103/68  Pulse: 80 78 75 61  Temp: 99.2 F (37.3 C) 99.3 F (37.4 C) 98.8 F (37.1 C) 98.3 F (36.8 C)  TempSrc: Oral Oral Oral   Resp: 18 18 18 18   Height:      Weight:      SpO2:   99%     Fundus firm Perineum without swelling.  Lab Results  Component Value Date   WBC 10.6* 09/20/2014   HGB 13.8 09/20/2014   HCT 40.6 09/20/2014   MCV 86.9 09/20/2014   PLT 216 09/20/2014    --/--/O POS (10/12 1219)/RI  A/P Post partum day 1.  Routine care.  Expect d/c routine.    [name] A

## 2014-09-20 NOTE — Progress Notes (Signed)
Clinical Social Work Department PSYCHOSOCIAL ASSESSMENT - MATERNAL/CHILD 09/20/2014  Patient:  Alcorta,Garrett C  Account Number:  [id number]  Admit Date:  09/19/2014  Childs Name:   Lillyn Ireland   Clinical Social Worker:  Keishia Ground, CLINICAL SOCIAL WORKER   Date/Time:  09/20/2014 09:45 AM  Date Referred:  09/19/2014   Referral source  Central Nursery     Referred reason  Adoption  Behavioral Health Issues  Substance Abuse   Other referral source:    I:  FAMILY / HOME ENVIRONMENT Child's legal guardian:  PARENT  Guardian - Name Guardian - Age Guardian - Address  Delainey Cosman [address] [place], [place] [address]   Other household support members/support persons Name Relationship DOB  Linda Parker MOTHER    Other support:   MOB stated that her brother and her father are supportive. She shared that her mother is her primary support person. The MGM shared that she is committed to providing support to the MOB.    II  PSYCHOSOCIAL DATA Information Source:  Family Interview  Financial and Community Resources Employment:   MOB works at the [employer].   Financial resources:  Medicaid If Medicaid - County:  [place]  School / Grade:  N/A Maternity Care Coordinator / Child Services Coordination / Early Interventions:   None reported  Cultural issues impacting care:   None reported    III  STRENGTHS Strengths  Adequate Resources  Supportive family/friends   Strength comment:  MOB's family is working together to ensure that all baby items are secured prior to baby's discharge home.   IV  RISK FACTORS AND CURRENT PROBLEMS Current Problem:  YES   Risk Factor & Current Problem Patient Issue Family Issue Risk Factor / Current Problem Comment  Mental Illness Y N MOB presents with a mental health history significant for bipolar.  MOB stated that medications were discontinued about 3 years ago.  Substance Abuse Y N MOB presents with a history of  ecstasy, last use more than 3 years ago.  MOB endorsed etoh use until she found out that she was pregnant at 5 months. Baby's UDS is negative.   Other - See comment Y N MOB origionally voiced intention to place the baby up for adoption.  MOB stated that she has changed her mind and has decided to keep the baby.    V  SOCIAL WORK ASSESSMENT CSW originally met with the MOB in L&D in order to begin to assist the MOB to process her thoughts and feelings related to adoption.  CSW met with the MOB in her room in order to follow-up.  MOB was receptive to the visit and provided consent for the MGM to be present.  MOB displayed a full range in affect and presented in a pleasant mood.  She was observed to be holding the baby and providing skin to skin during the visit.  She frequently smiled when she was bonding with the baby, and presented as attentive to her.  CSW inquired about her thoughts and feelings related to placing the baby up to the adoption.  MOB stated that she appreciated the visit on 10/12 from CSW since she had not truly considered what it may be like to actually place the baby up for adoption.  She stated that once the baby was born ,she could not imagine another family raising her since it was love at first sight.  CSW and MOB reviewed the reasons why MOB originally wanted to place   the baby up for adoption, but stated that she is no longer scared to raise the baby since she knows that her mother will support her.  She stated that she knows that she can go to college in the future, and that she is ready to place her baby's needs in front of herself.  She stated that the gains of being a mother outweigh the loses.  MOB expressed strong level of confidence in her decision to keep the baby, and continued to express that she is looking forward to "settling" down.  She shared that since she has decided to keep the baby, the family has been providing support in order to ensure that they have everything  needed since they did not previously have any clothes, a car seat, or other basic needs.  MOB was receptive to the referral for [facility] and signed the consent for CSW to make referral.   CSW inquired about mental health history.  MOB stated that she admitted to [facility] in 2010.  MGM stated that she remembered someone discussing potential diagnosis of schizophrenia, but upon further exploration, the symptoms discussed highlight potential diagnosis of substance induced psychosis since MOB reported that she was consuming etoh and ecstasy at the same time.  MOB shared that she was prescribed medications while at [facility], and then followed-up with therapy and medication management at [facility].  She stated that she was previously prescribed Geodone, but that she has not received medications in past 2-3 years.  MOB shared belief that her symptoms have been stable, but MGM expressed concern about her mood swings during the pregnancy.   CSW continued to explore substance use history.  MOB endorsed ecstasy use in 2010, but denied use during her pregnancy.  She shared that she consumed etoh until she learned that she was pregnant.  MOB denied difficulties stopping etoh , and shared that she was able to stop without treatment.  She expressed regret for using while pregnant, and stated that she would have stopped sooner if she had known that she was pregnant since she would never want to cause harm to a baby.  MOB discussed efforts to establish boundaries with peers in order to reduce exposure or pressure to use again.  CSW continued to explore with the MOB how untreated mental health and substance use may impact herself and her daughter moving forward.  MOB became tearful as CSW encouraged her to reflect on past consequences of untreated symptoms and substance use, and she expressed awareness that she will need to accept help in the future if she notes symptoms or feels triggered to re-engage in substance use.  MOB stated that  she continues to prefer to not be on medications for her mental health, but that if she were to recommended to re-start she would consider since it would be in the best interest for her and her daughter.   CSW did not identify any barriers to discharge at this time.    VI SOCIAL WORK PLAN Social Work Secretary/administrator Education  Information/Referral to [employer]   Type of pt/family education:   Postpartum depression   If child protective services report - county:   If child protective services report - date:   Information/referral to community resources comment:   CSW to complete referral for [employer] [facility] [employer]. CSW to provide referral information for therapy.    Other social work plan:   CSW to follow-up with the family on 10/14 to  discuss hospital drug screen policy and to provide referral information for mental health treatment.

## 2014-09-21 MED ORDER — DOCUSATE SODIUM 100 MG PO CAPS: 100.0000 mg | ORAL_CAPSULE | Freq: Two times a day (BID) | ORAL | Status: DC

## 2014-09-21 MED ORDER — OXYCODONE-ACETAMINOPHEN 5-325 MG PO TABS: 2.0000 | ORAL_TABLET | ORAL | Status: DC | PRN

## 2014-09-21 MED ORDER — IBUPROFEN 600 MG PO TABS: 600.0000 mg | ORAL_TABLET | Freq: Four times a day (QID) | ORAL | Status: DC | PRN

## 2014-10-10 ENCOUNTER — Encounter (HOSPITAL_COMMUNITY): Payer: Self-pay | Admitting: *Deleted

## 2014-12-24 ENCOUNTER — Emergency Department (HOSPITAL_COMMUNITY)
Admission: EM | Admit: 2014-12-24 | Discharge: 2014-12-24 | Disposition: A | Discharge status: Home / Self Care | Payer: Medicaid Other | Attending: Emergency Medicine | Admitting: Emergency Medicine

## 2014-12-24 ENCOUNTER — Encounter (HOSPITAL_COMMUNITY): Payer: Self-pay

## 2014-12-24 ENCOUNTER — Emergency Department (HOSPITAL_COMMUNITY)
Admission: EM | Admit: 2014-12-24 | Discharge: 2014-12-24 | Discharge status: Against Medical Advice | Payer: Medicaid Other

## 2014-12-24 DIAGNOSIS — Z720 Tobacco use: Secondary | ICD-10-CM | POA: Insufficient documentation

## 2014-12-24 DIAGNOSIS — R443 Hallucinations, unspecified: Secondary | ICD-10-CM | POA: Diagnosis not present

## 2014-12-24 DIAGNOSIS — Z79899 Other long term (current) drug therapy: Secondary | ICD-10-CM | POA: Diagnosis not present

## 2014-12-24 DIAGNOSIS — J45909 Unspecified asthma, uncomplicated: Secondary | ICD-10-CM | POA: Diagnosis not present

## 2014-12-24 DIAGNOSIS — Z8659 Personal history of other mental and behavioral disorders: Secondary | ICD-10-CM | POA: Insufficient documentation

## 2014-12-24 NOTE — ED Notes (Signed)
Call patient name in lobby no answer

## 2014-12-24 NOTE — ED Notes (Signed)
She is in no distress; and her mother is now with her.  She has just spoken with our Child psychotherapistsocial worker; and pt's. Mother articul[facility].  Pt. Is cheerful and cooperative, as is her mother, upon d/c.

## 2014-12-24 NOTE — ED Notes (Signed)
Pt told registration that she did not want to stay and be seen, she has to be at work at [employer]11am. Attempted to call pt to triage and she did not answer.

## 2014-12-24 NOTE — ED Provider Notes (Signed)
CSN: [id number]     Arrival date & time 12/24/14  1207 History  This chart was scribed for non-physician practitioner working with Linwood DibblesJon Knapp, MD by Elveria Risingimelie Horne, ED Scribe. This patient was seen in room WTR3/WLPT3 and the patient's care was started at 1:21 PM.   Chief Complaint  Patient presents with  . Hallucinations   The history is provided by the patient. No language interpreter was used.   HPI Comments: Melinda Baker is a 29 y.o. female who presents to the Emergency Department complaining of hallucinations. Mother reports that patient has history of Bipolar disorder and she is experiencing disorder symptoms because she is without medication. Patient denies suicidal or homicidal ideation or substance abuse. Patient reports that she does not want to be admitted, but instead wants medication to control her disorder. Patient and mother report that they [place] health center [place] where they requested evaluation and they were referred to [facility]. Patient has an infant child with her and does not want to go through inpatient route. They want to talk about medications.The mom feels comfortable with the patient not doing the inpatient route. Per nurse she does admit to seeing demonic things that are disturbing but they are not telling her to harm herself. [facility] and [facility] due to concern about her seeing demons. They admit she denied SI/HI.   Past Medical History  Diagnosis Date  . Allergy   . Bipolar 1 disorder   . Asthma    Past Surgical History  Procedure Laterality Date  . Dilation and curettage of uterus     Family History  Problem Relation Age of Onset  . Diabetes Mother   . Stroke Mother     TIA  . Diabetes Father   . Cancer Paternal Aunt   . Diabetes Maternal Grandmother    History  Substance Use Topics  . Smoking status: Current Every Day Smoker -- 0.50 packs/day for 5 years  . Smokeless tobacco: Never Used     Comment:  e-cig, trying to "do better"  . Alcohol Use: Yes     Comment: 3-4x per week, 2-3 per day - doing better    OB History    Gravida Para Term Preterm AB TAB SAB Ectopic Multiple Living   2 1 1  1     1      Review of Systems  Psychiatric/Behavioral: Positive for hallucinations. Negative for suicidal ideas.    Allergies  Review of patient's allergies indicates no known allergies.  Home Medications   Prior to Admission medications   Medication Sig Start Date End Date Taking? Authorizing Provider  docusate sodium (COLACE) 100 MG capsule Take 1 capsule (100 mg total) by mouth 2 (two) times daily. 09/21/14   Kendra H. Tenny Crawoss, MD  ibuprofen (ADVIL,MOTRIN) 600 MG tablet Take 1 tablet (600 mg total) by mouth every 6 (six) hours as needed. 09/21/14   Kendra H. Tenny Crawoss, MD  oxyCODONE-acetaminophen (ROXICET) 5-325 MG per tablet Take 2 tablets by mouth every 4 (four) hours as needed. May take 1-2 tablets every 4-6 hours as needed for pain 09/21/14   Kendra H. Tenny Crawoss, MD  Prenatal Vit-Fe Fumarate-FA (PRENATAL MULTIVITAMIN) TABS tablet Take 1 tablet by mouth daily at 12 noon.    Historical Provider, MD   There were no vitals taken for this visit. Physical Exam  Constitutional: She is oriented to person, place, and time. She appears well-developed and well-nourished. No distress.  Well  kept and cared for  HENT:  Head: Normocephalic and atraumatic.  Eyes: EOM are normal.  Neck: Neck supple. No tracheal deviation present.  Cardiovascular: Normal rate.   Pulmonary/Chest: Effort normal. No respiratory distress.  Musculoskeletal: Normal range of motion.  Neurological: She is alert and oriented to person, place, and time.  Skin: Skin is warm and dry.  Psychiatric: She has a normal mood and affect. Her speech is normal. Her mood appears not anxious. She is actively hallucinating (audtiroty and visual). She does not exhibit a depressed mood. She expresses no homicidal and no suicidal ideation.  Nursing note and  vitals reviewed.   ED Course  Procedures (including critical care time)  COORDINATION OF CARE: 1:30 PM- Discussed treatment plan with patient at bedside and patient agreed to plan.   Labs Review Labs Reviewed - No data to display  Imaging Review No results found.   EKG Interpretation None      MDM   Final diagnoses:  Hallucinations    The mom is present with the patient. The patient has an infant baby who looks extremely well taken care of. The mom is very caring, the patients mother is very dependable and reliable. The patient and her mother do not want her to go through the blood work or scrubbing process. They feel she can be seen outpatient. Will refer pt back to [facility].  No SI/HI, or substance abuse concerns.  29 y.o.Melinda Baker's evaluation in the Emergency Department is complete. It has been determined that no acute conditions requiring further emergency intervention are present at this time. The patient/guardian have been advised of the diagnosis and plan. We have discussed signs and symptoms that warrant return to the ED, such as changes or worsening in symptoms.  Vital signs are stable at discharge.  Patient/guardian has voiced understanding and agreed to follow-up with the PCP or specialist.   I personally performed the services described in this documentation, which was scribed in my presence. The recorded information has been reviewed and is accurate.    Dorthula Matas, PA-C 12/24/14 1344  Linwood Dibbles, MD 12/25/14 [phone]

## 2014-12-24 NOTE — ED Notes (Signed)
Patient refused to get undress and stated she did not want to leave her daughter.  I made the nurse aware.

## 2014-12-24 NOTE — Discharge Instructions (Signed)

## 2014-12-24 NOTE — ED Notes (Signed)
She speaks in flight-of-ideas paranoia-type paragraphs.  She has with her in appearance a well-cared for infant girl, which she tells us she had in Oct. Of 2015.  She mentions a man named "mike" who is not the baby's father.  Among other things, she states she has lived with Kathlene NovemberMike; but is not living with him now, rather she is living with her mother.  She denies s.i./h.i. And is cheerful and smiling in her demeanor.  She mentions having thoughts and visions of "devilish, demonic things; and Kathlene NovemberMike showed me a video of these things.  When I directly ask her what she is seeking from us/our doctor; she states "I guess I just need some medicine for sleep or for me to stop thinking about Mike--I just need to leave him alone".

## 2015-08-27 ENCOUNTER — Encounter (HOSPITAL_COMMUNITY): Payer: Self-pay

## 2015-08-27 ENCOUNTER — Emergency Department (HOSPITAL_COMMUNITY)
Admission: EM | Admit: 2015-08-27 | Discharge: 2015-08-27 | Disposition: A | Discharge status: Home / Self Care | Payer: Medicaid Other | Attending: Emergency Medicine | Admitting: Emergency Medicine

## 2015-08-27 DIAGNOSIS — Y998 Other external cause status: Secondary | ICD-10-CM | POA: Diagnosis not present

## 2015-08-27 DIAGNOSIS — Z79899 Other long term (current) drug therapy: Secondary | ICD-10-CM | POA: Diagnosis not present

## 2015-08-27 DIAGNOSIS — S91201A Unspecified open wound of right great toe with damage to nail, initial encounter: Secondary | ICD-10-CM | POA: Insufficient documentation

## 2015-08-27 DIAGNOSIS — Y9389 Activity, other specified: Secondary | ICD-10-CM | POA: Diagnosis not present

## 2015-08-27 DIAGNOSIS — Z8659 Personal history of other mental and behavioral disorders: Secondary | ICD-10-CM | POA: Diagnosis not present

## 2015-08-27 DIAGNOSIS — J45909 Unspecified asthma, uncomplicated: Secondary | ICD-10-CM | POA: Insufficient documentation

## 2015-08-27 DIAGNOSIS — X58XXXA Exposure to other specified factors, initial encounter: Secondary | ICD-10-CM | POA: Diagnosis not present

## 2015-08-27 DIAGNOSIS — S91209A Unspecified open wound of unspecified toe(s) with damage to nail, initial encounter: Secondary | ICD-10-CM

## 2015-08-27 DIAGNOSIS — Z720 Tobacco use: Secondary | ICD-10-CM | POA: Diagnosis not present

## 2015-08-27 DIAGNOSIS — Y9289 Other specified places as the place of occurrence of the external cause: Secondary | ICD-10-CM | POA: Diagnosis not present

## 2015-08-27 NOTE — Discharge Instructions (Signed)
Please follow-up with primary care signs of infection present.

## 2015-08-27 NOTE — ED Provider Notes (Signed)
CSN: [id number]     Arrival date & time 08/27/15  1234 History  This chart was scribed for non-physician practitioner, Eyvonne Mechanic, PA-C,  working with Nelva Nay, MD by Freida Busman, ED Scribe. This patient was seen in room WTR9/WTR9 and the patient's care was started at 2:51 PM.      Chief Complaint  Patient presents with  . toenail check     The history is provided by the patient. No language interpreter was used.     HPI Comments:  Melinda Baker is a 29 y.o. female who presents to the Emergency Department to have her right great toe evaulated after the toenail fell off 3 days ago. She notes she had been wearing tight shoes and the nail started to drain pus and subsequently the nail fell off. She denies pain to the toe and her right foot. She also denies any other injury of trauma to the toe. No modifying factors or associated symptoms noted.    Past Medical History  Diagnosis Date  . Allergy   . Bipolar 1 disorder   . Asthma    Past Surgical History  Procedure Laterality Date  . Dilation and curettage of uterus     Family History  Problem Relation Age of Onset  . Diabetes Mother   . Stroke Mother     TIA  . Diabetes Father   . Cancer Paternal Aunt   . Diabetes Maternal Grandmother    Social History  Substance Use Topics  . Smoking status: Current Every Day Smoker -- 0.50 packs/day for 5 years    Types: Cigarettes  . Smokeless tobacco: Never Used     Comment: e-cig, trying to "do better"  . Alcohol Use: Yes     Comment: once a week.   OB History    Gravida Para Term Preterm AB TAB SAB Ectopic Multiple Living   Review of Systems  A complete 10 system review of systems was obtained and all systems are negative except as noted in the HPI and PMH.    Allergies  Review of patient's allergies indicates no known allergies.  Home Medications   Prior to Admission medications   Medication Sig Start Date End Date Taking? Authorizing  Provider  docusate sodium (COLACE) 100 MG capsule Take 1 capsule (100 mg total) by mouth 2 (two) times daily. 09/21/14   Waynard Reeds, MD  ibuprofen (ADVIL,MOTRIN) 600 MG tablet Take 1 tablet (600 mg total) by mouth every 6 (six) hours as needed. 09/21/14   Waynard Reeds, MD  oxyCODONE-acetaminophen (ROXICET) 5-325 MG per tablet Take 2 tablets by mouth every 4 (four) hours as needed. May take 1-2 tablets every 4-6 hours as needed for pain 09/21/14   Waynard Reeds, MD  Prenatal Vit-Fe Fumarate-FA (PRENATAL MULTIVITAMIN) TABS tablet Take 1 tablet by mouth daily at 12 noon.    Historical Provider, MD   BP 111/82 mmHg  Pulse 84  Temp(Src) 98 F (36.7 C) (Oral)  Resp 16  SpO2 98% Physical Exam  Constitutional: She is oriented to person, place, and time. She appears well-developed and well-nourished. No distress.  HENT:  Head: Normocephalic and atraumatic.  Eyes: Conjunctivae are normal.  Cardiovascular: Normal rate.   Pulmonary/Chest: Effort normal.  Abdominal: She exhibits no distension.  Neurological: She is alert and oriented to person, place, and time.  Skin: Skin is warm and dry.  Missing toenail on the right  great toe; no signs of infection, warmth, redness, discharge or tenderness. Remainder of right foot nml   Psychiatric: She has a normal mood and affect.  Nursing note and vitals reviewed.   ED Course  Procedures  Labs Review Labs Reviewed - No data to display  Imaging Review No results found. I have personally reviewed and evaluated these images and lab results as part of my medical decision-making.   EKG Interpretation None      MDM   Final diagnoses:  Toenail avulsion, initial encounter    Labs:   Imaging:   Consults:   Therapeutics:   Discharge Meds:  Assessment/Plan: Patient presents for evaluation of right great toenail. Toenail has been avulsed, nontender palpation, no signs of infection.  Return precautions given, patient verbalized understanding and  agreement for today's plan had no further questions or concerns at time of discharge.   I personally performed the services described in this documentation, which was scribed in my presence. The recorded information has been reviewed and is accurate.   Eyvonne Mechanic, PA-C 08/30/15 1610  Nelva Nay, MD 08/31/15 1100

## 2015-08-27 NOTE — ED Notes (Signed)
Patient states she was wearing shoes that were too small for her and without socks. Patient states her left great toenail fell off 3 days ago and wants her nail checked.

## 2015-10-29 ENCOUNTER — Emergency Department (INDEPENDENT_AMBULATORY_CARE_PROVIDER_SITE_OTHER)
Admission: EM | Admit: 2015-10-29 | Discharge: 2015-10-29 | Disposition: A | Discharge status: Home / Self Care | Payer: Medicaid Other | Source: Home / Self Care | Attending: Emergency Medicine | Admitting: Emergency Medicine

## 2015-10-29 ENCOUNTER — Encounter (HOSPITAL_COMMUNITY): Payer: Self-pay | Admitting: Emergency Medicine

## 2015-10-29 DIAGNOSIS — R21 Rash and other nonspecific skin eruption: Secondary | ICD-10-CM | POA: Diagnosis not present

## 2015-10-29 MED ORDER — TRIAMCINOLONE ACETONIDE 0.1 % EX CREA: 1.0000 "application " | TOPICAL_CREAM | Freq: Two times a day (BID) | CUTANEOUS | Status: DC

## 2015-10-29 MED ORDER — PERMETHRIN 5 % EX CREA: TOPICAL_CREAM | CUTANEOUS | Status: DC

## 2015-10-29 NOTE — ED Provider Notes (Signed)
CSN: [id number]     Arrival date & time 10/29/15  1433 History   First MD Initiated Contact with Patient 10/29/15 1458     Chief Complaint  Patient presents with  . Rash   (Consider location/radiation/quality/duration/timing/severity/associated sxs/prior Treatment) HPI  Melinda Baker is a 29 y.o. female presenting with rash x 2 weeks. Rash started on her hands and now involves arms, neck, upper back, face, and a small amount on ankles. Described as pruritic and with mild erythematous swelling in a few localized spots. Rash is not painful. Patient has not tried anything for the rash. No one else has the rash. She does not share a bed with anyone. No new exposures to soaps/detergents/products. Began a new anti-depressant a couple weeks ago. No recent travel. Denies fever, tongue/throat swelling, itchy eyes, sore throat, SOB, nausea, vomiting, diarrhea, numbness, or tingling. No history of allergies or eczema.   Past Medical History  Diagnosis Date  . Allergy   . Bipolar 1 disorder (HCC)   . Asthma    Past Surgical History  Procedure Laterality Date  . Dilation and curettage of uterus     Family History  Problem Relation Age of Onset  . Diabetes Mother   . Stroke Mother     TIA  . Diabetes Father   . Cancer Paternal Aunt   . Diabetes Maternal Grandmother    Social History  Substance Use Topics  . Smoking status: Current Every Day Smoker -- 0.50 packs/day for 5 years    Types: Cigarettes  . Smokeless tobacco: Never Used     Comment: e-cig, trying to "do better"  . Alcohol Use: Yes     Comment: once a week.   OB History    Gravida Para Term Preterm AB TAB SAB Ectopic Multiple Living   Review of Systems  Constitutional: Negative for fever, chills, appetite change and fatigue.  HENT: Negative for congestion, ear pain, rhinorrhea, sore throat and trouble swallowing.   Eyes: Negative for redness, itching and visual disturbance.  Respiratory: Negative for  cough, chest tightness, shortness of breath and wheezing.   Cardiovascular: Negative for chest pain.  Gastrointestinal: Negative for nausea, vomiting, diarrhea and constipation.  Genitourinary: Negative for dysuria.  Musculoskeletal: Negative for back pain.  Skin: Positive for rash.  Allergic/Immunologic: Negative for environmental allergies and food allergies.  Neurological: Negative for dizziness, syncope, weakness, numbness and headaches.    Allergies  Review of patient's allergies indicates no known allergies.  Home Medications   Prior to Admission medications   Medication Sig Start Date End Date Taking? Authorizing Provider  docusate sodium (COLACE) 100 MG capsule Take 1 capsule (100 mg total) by mouth 2 (two) times daily. 09/21/14   Waynard Reeds, MD  ibuprofen (ADVIL,MOTRIN) 600 MG tablet Take 1 tablet (600 mg total) by mouth every 6 (six) hours as needed. 09/21/14   Waynard Reeds, MD  oxyCODONE-acetaminophen (ROXICET) 5-325 MG per tablet Take 2 tablets by mouth every 4 (four) hours as needed. May take 1-2 tablets every 4-6 hours as needed for pain 09/21/14   Waynard Reeds, MD  permethrin (ELIMITE) 5 % cream Apply to affected area once from the neck down and leave on overnight. 10/29/15   Charm Rings, MD  Prenatal Vit-Fe Fumarate-FA (PRENATAL MULTIVITAMIN) TABS tablet Take 1 tablet by mouth daily at 12 noon.    Historical Provider, MD  triamcinolone cream (KENALOG) 0.1 % Apply  1 application topically 2 (two) times daily. 10/29/15   Charm RingsErin J Zhana Jeangilles, MD   Meds Ordered and Administered this Visit  Medications - No data to display  BP 130/77 mmHg  Pulse 81  Temp(Src) 98.8 F (37.1 C) (Oral)  Resp 16  SpO2 99% No data found.   Physical Exam  Constitutional: She is oriented to person, place, and time. She appears well-developed and well-nourished. No distress.  HENT:  Head: Normocephalic and atraumatic.  Right Ear: External ear normal.  Nose: Nose normal.  Mouth/Throat: Oropharynx  is clear and moist.  Left ear with moderate cerumen.  Eyes: Conjunctivae are normal. Pupils are equal, round, and reactive to light.  Neck: Normal range of motion. Neck supple.  Cardiovascular: Normal rate, regular rhythm, normal heart sounds and intact distal pulses.   Pulmonary/Chest: Effort normal and breath sounds normal. She has no wheezes.  Lymphadenopathy:    She has no cervical adenopathy.  Neurological: She is alert and oriented to person, place, and time. No sensory deficit.  Skin: Rash noted. She is not diaphoretic.  Skin throughout is very dry with excoriations noted over thighs and arms. Papular rash noted to dorsal hands (particularly on ulnar sides), arms, neck, upper back, and ankles. Possible burrows on hands. Web-spacing not proportionally affected. Couple spots with localized erythematous swelling resembling insect bites to right wrist, left face, and left hand.   Psychiatric: She has a normal mood and affect. Her behavior is normal. Judgment and thought content normal.    ED Course  Procedures (including critical care time)  Labs Review Labs Reviewed - No data to display  Imaging Review No results found.   MDM   1. Rash    29 yo female with rash x 2 weeks. Papular pruritic rash with some evidence of burrows on hands, not particularly on the web-spaces. No one else in household has the rash, but no one shares the same bed. Possible scabies. Patient also with very dry skin and eczematous-like rash over arms, neck, and upper back. Few lesions noted to wrist, face, and hand appear to be insect bites with localized reaction. Rx Permethrin for scabies and Rx Triamcinolone cream 0.1% BID. Patient declined IM depo-medrol while in urgent care.   Patient to follow up with Dr. Terri PiedraLupton (dermatology) if rash does not improve in two weeks.  Patient voices no other complaints at this time.   I have seen and examined with patient with a student.  I have reviewed and edited the  above note as necessary.   Charm RingsErin J Tiona Ruane, MD 10/29/15 1600

## 2015-10-29 NOTE — Discharge Instructions (Signed)
Apply Permethrin cream tonight from the neck down and leave on overnight for possible scabies. Wash all linens and towels in hot water. Apply Triamcinolone cream to affected areas twice daily. If rash is not improving, follow up with Dr. Terri PiedraLupton (dermatology) in two weeks.   Rash A rash is a change in the color or texture of the skin. There are many different types of rashes. You may have other problems that accompany your rash. CAUSES   Infections.  Allergic reactions. This can include allergies to pets or foods.  Certain medicines.  Exposure to certain chemicals, soaps, or cosmetics.  Heat.  Exposure to poisonous plants.  Tumors, both cancerous and noncancerous. SYMPTOMS   Redness.  Scaly skin.  Itchy skin.  Dry or cracked skin.  Bumps.  Blisters.  Pain. DIAGNOSIS  Your caregiver may do a physical exam to determine what type of rash you have. A skin sample (biopsy) may be taken and examined under a microscope. TREATMENT  Treatment depends on the type of rash you have. Your caregiver may prescribe certain medicines. For serious conditions, you may need to see a skin doctor (dermatologist). HOME CARE INSTRUCTIONS   Avoid the substance that caused your rash.  Do not scratch your rash. This can cause infection.  You may take cool baths to help stop itching.  Only take over-the-counter or prescription medicines as directed by your caregiver.  Keep all follow-up appointments as directed by your caregiver. SEEK IMMEDIATE MEDICAL CARE IF:  You have increasing pain, swelling, or redness.  You have a fever.  You have new or severe symptoms.  You have body aches, diarrhea, or vomiting.  Your rash is not better after 3 days. MAKE SURE YOU:  Understand these instructions.  Will watch your condition.  Will get help right away if you are not doing well or get worse.   This information is not intended to replace advice given to you by your health care provider.  Make sure you discuss any questions you have with your health care provider.   Document Released: 11/15/2002 Document Revised: 12/16/2014 Document Reviewed: 04/12/2015 Elsevier Interactive Patient Education [employer]2016 [employer]

## 2019-10-23 ENCOUNTER — Encounter (HOSPITAL_COMMUNITY): Payer: Self-pay

## 2019-10-23 ENCOUNTER — Other Ambulatory Visit: Payer: Self-pay

## 2019-10-23 ENCOUNTER — Emergency Department (HOSPITAL_COMMUNITY)
Admission: EM | Admit: 2019-10-23 | Discharge: 2019-10-23 | Disposition: A | Discharge status: Home / Self Care | Payer: Self-pay | Attending: Emergency Medicine | Admitting: Emergency Medicine

## 2019-10-23 DIAGNOSIS — Z79899 Other long term (current) drug therapy: Secondary | ICD-10-CM | POA: Insufficient documentation

## 2019-10-23 DIAGNOSIS — F17210 Nicotine dependence, cigarettes, uncomplicated: Secondary | ICD-10-CM | POA: Insufficient documentation

## 2019-10-23 DIAGNOSIS — T192XXA Foreign body in vulva and vagina, initial encounter: Secondary | ICD-10-CM

## 2019-10-23 DIAGNOSIS — Z0389 Encounter for observation for other suspected diseases and conditions ruled out: Secondary | ICD-10-CM | POA: Insufficient documentation

## 2019-10-23 NOTE — Discharge Instructions (Addendum)
Follow-up with [facility] or your OB/GYN as needed for further management of your contraception. Return to the emergency room with any new, worsening, or concerning symptoms.

## 2019-10-23 NOTE — ED Provider Notes (Signed)
[facility]-EMERGENCY DEPT Provider Note   CSN: [id number] Arrival date & time: 10/23/19  1620     History   Chief Complaint Chief Complaint  Patient presents with  . Pelvic Pain    HPI Melinda Baker is a 33 y.o. female presenting to see if NuvaRing is in place.  Patient states 2 weeks ago she changed to a new brand of NuvaRing.  She had intercourse last night the first time since the new NuvaRing was placed.  Patient states after having intercourse, she could not find the NuvaRing.  She took Plan B today, however is here to see if the NuvaRing is in place.  She denies vaginal pain or vaginal bleeding.  She has no abdominal pain or discomfort.  She has not taken anything for pain.  She has no other medical problems besides bipolar, takes medicine for this and no other medicines daily.    HPI  Past Medical History:  Diagnosis Date  . Allergy   . Asthma   . Bipolar 1 disorder [facility])     Patient Active Problem List   Diagnosis Date Noted  . Indication for care in labor or delivery 09/19/2014    Past Surgical History:  Procedure Laterality Date  . DILATION AND CURETTAGE OF UTERUS       OB History    Gravida  2   Para  1   Term  1   Preterm      AB  1   Living  1     SAB      TAB      Ectopic      Multiple      Live Births  1            Home Medications    Prior to Admission medications   Medication Sig Start Date End Date Taking? Authorizing Provider  docusate sodium (COLACE) 100 MG capsule Take 1 capsule (100 mg total) by mouth 2 (two) times daily. 09/21/14   Waynard Reeds, MD  ibuprofen (ADVIL,MOTRIN) 600 MG tablet Take 1 tablet (600 mg total) by mouth every 6 (six) hours as needed. 09/21/14   Waynard Reeds, MD  oxyCODONE-acetaminophen (ROXICET) 5-325 MG per tablet Take 2 tablets by mouth every 4 (four) hours as needed. May take 1-2 tablets every 4-6 hours as needed for pain 09/21/14   Waynard Reeds, MD  permethrin  (ELIMITE) 5 % cream Apply to affected area once from the neck down and leave on overnight. 10/29/15   Charm Rings, MD  Prenatal Vit-Fe Fumarate-FA (PRENATAL MULTIVITAMIN) TABS tablet Take 1 tablet by mouth daily at 12 noon.    [provider]  triamcinolone cream (KENALOG) 0.1 % Apply 1 application topically 2 (two) times daily. 10/29/15   Charm Rings, MD    Family History Family History  Problem Relation Age of Onset  . Diabetes Mother   . Stroke Mother        TIA  . Diabetes Father   . Cancer Paternal Aunt   . Diabetes Maternal Grandmother     Social History Social History   Tobacco Use  . Smoking status: Current Every Day Smoker    Packs/day: 0.50    Years: 5.00    Pack years: 2.50    Types: Cigarettes  . Smokeless tobacco: Never Used  . Tobacco comment: e-cig, trying to "do better"  Substance Use Topics  . Alcohol use: Yes    Comment: once a week.  Marland Kitchen  Drug use: No    Comment: hx of ectasy, marijuana use      Allergies   Patient has no known allergies.   Review of Systems Review of Systems  Gastrointestinal: Negative for abdominal pain.  Genitourinary: Negative for vaginal bleeding and vaginal pain.     Physical Exam Updated Vital Signs BP 136/90   Pulse 75   Temp 98.5 F (36.9 C) (Oral)   Resp 16   Ht 5\' 1"  (1.549 m)   Wt 50 kg   LMP 09/22/2019   SpO2 99%   BMI 20.83 kg/m   Physical Exam Vitals signs and nursing note reviewed. Exam conducted with a chaperone present.  Constitutional:      General: She is not in acute distress.    Appearance: She is well-developed.     Comments: Resting comfortably in the bed in no acute distress  HENT:     Head: Normocephalic and atraumatic.  Neck:     Musculoskeletal: Normal range of motion.  Pulmonary:     Effort: Pulmonary effort is normal.  Abdominal:     General: There is no distension.     Palpations: Abdomen is soft. There is no mass.     Tenderness: There is no abdominal tenderness.  There is no guarding or rebound.     Comments: No tenderness to palpation the abdomen.  Soft without rigidity, guarding, distention.  Negative rebound.  Genitourinary:    Comments: No NuvaRing found on the pelvic exam.  No CMT or adnexal tenderness.  No vaginal bleeding. Musculoskeletal: Normal range of motion.  Skin:    General: Skin is warm.     Capillary Refill: Capillary refill takes less than 2 seconds.     Findings: No rash.  Neurological:     Mental Status: She is alert and oriented to person, place, and time.      ED Treatments / Results  Labs (all labs ordered are listed, but only abnormal results are displayed) Labs Reviewed - No data to display  EKG None  Radiology No results found.  Procedures Procedures (including critical care time)  Medications Ordered in ED Medications - No data to display   Initial Impression / Assessment and Plan / ED Course  I have reviewed the triage vital signs and the nursing notes.  Pertinent labs & imaging results that were available during my care of the patient were reviewed by me and considered in my medical decision making (see chart for details).        Patient presenting to see if NuvaRing is still in place.  Physical exam reassuring, patient appears nontoxic.  No abdominal pain.  GU exam shows NuvaRing is not present.  No vaginal bleeding or CMT.  Discussed continued management of contraception with OB/GYN.  At this time, patient appears safe for discharge.  Return precautions given.  Patient states she understands and agrees to plan.   Final Clinical Impressions(s) / ED Diagnoses   Final diagnoses:  Foreign body in vagina, initial encounter    ED Discharge Orders    None       Franchot Heidelberg, PA-C 10/23/19 2002    Valarie Merino, MD 10/24/19 1152

## 2019-10-23 NOTE — ED Triage Notes (Signed)
Pt endorses had relations yesterday. Her form of birth control nuvo ring. Pt unable to determine if it is still present in her. Pt denies discharge or pelvic issues. Pt admits to multiple attempts to locate inside her without success. Admits to taking Plan B today.

## 2020-08-30 ENCOUNTER — Encounter (HOSPITAL_COMMUNITY): Payer: Self-pay | Admitting: Psychiatry

## 2020-08-30 ENCOUNTER — Telehealth (INDEPENDENT_AMBULATORY_CARE_PROVIDER_SITE_OTHER): Payer: No Payment, Other | Admitting: Psychiatry

## 2020-08-30 ENCOUNTER — Other Ambulatory Visit: Payer: Self-pay

## 2020-08-30 DIAGNOSIS — F319 Bipolar disorder, unspecified: Secondary | ICD-10-CM | POA: Diagnosis not present

## 2020-08-30 MED ORDER — RISPERIDONE 2 MG PO TABS
2.0000 mg | ORAL_TABLET | Freq: Two times a day (BID) | ORAL | 2 refills | Status: DC
Start: 2020-08-30 — End: 2020-12-05

## 2020-08-30 NOTE — Progress Notes (Signed)
Psychiatric Initial Adult Assessment  Virtual Visit via Video Note  I connected with Melinda Baker on 08/30/20 at  9:30 AM EDT by a video enabled telemedicine application and verified that I am speaking with the correct person using two identifiers.  Location: Patient: Home Provider: Clinic   I discussed the limitations of evaluation and management by telemedicine and the availability of in person appointments. The patient expressed understanding and agreed to proceed.  I provided 45 minutes of non-face-to-face time during this encounter.     Patient Identification: [name] MRN:  [id number] Date of Evaluation:  08/30/2020 Referral Source: Vesta Mixer Chief Complaint:  I feel normal  Visit Diagnosis:    ICD-10-CM   1. Bipolar I disorder (HCC)  F31.9 risperiDONE (RISPERDAL) 2 MG tablet    History of Present Illness: 34 year old female seen today for initial psychiatric evaluation.  She was referred to outpatient psychiatry by [facility] for medication management.  She has a psychiatric history of bipolar 1 disorder.  She is currently being managed on Risperdal 2 mg nightly.  She notes that her medications are effective in managing her psychiatric condition.  Today she is well-groomed, pleasant, cooperative, engaged in conversation, and maintains eye contact.  She notes that she feels normal and denies symptoms of anxiety, depression, mania, SI/HI/AVH or paranoia.  Patient informed Clinical research associate that she no longer becomes angry or irritable at work.  She notes that she works at [employer].  Patient also informed provider that she has a 1-year-old daughter whom she loves and spoils.  She also reports that she has been in a relationship for 4 years and find her boyfriend supportive.  No medication adjustments made today.  Patient is agreeable to continue all medications as prescribed.  She will follow-up with outpatient provider in 3 months for reevaluation.  No other concerns noted at this  time.  Associated Signs/Symptoms: Depression Symptoms:  Denies (Hypo) Manic Symptoms:  Denies Anxiety Symptoms:  Denies Psychotic Symptoms:  Denies PTSD Symptoms: NA  Past Psychiatric History: Bipolar 1  Previous Psychotropic Medications: Patient reports that she has been on for her medications however cannot remember the names.  She notes that they were discontinued because they caused her to break out.  Provider reviewed records and it is noted that patient was on Ambien in the past.  Substance Abuse History in the last 12 months:  No.  Consequences of Substance Abuse: NA  Past Medical History:  Past Medical History:  Diagnosis Date  . Allergy   . Asthma   . Bipolar 1 disorder [facility])     Past Surgical History:  Procedure Laterality Date  . DILATION AND CURETTAGE OF UTERUS      Family Psychiatric History: Unknown  Family History:  Family History  Problem Relation Age of Onset  . Diabetes Mother   . Stroke Mother        TIA  . Diabetes Father   . Cancer Paternal Aunt   . Diabetes Maternal Grandmother     Social History:   Social History   Socioeconomic History  . Marital status: Single    Spouse name: Not on file  . Number of children: Not on file  . Years of education: Not on file  . Highest education level: Not on file  Occupational History    Comment: server, [employer]  Tobacco Use  . Smoking status: Current Every Day Smoker    Packs/day: 0.50    Years: 5.00    Pack years:  2.50    Types: Cigarettes  . Smokeless tobacco: Never Used  . Tobacco comment: e-cig, trying to "do better"  Substance and Sexual Activity  . Alcohol use: Yes    Comment: once a week.  . Drug use: No    Comment: hx of ectasy, marijuana use   . Sexual activity: Yes    Partners: Male    Birth control/protection: Condom  Other Topics Concern  . Not on file  Social History Narrative   Lives with roommate.  Melinda Baker.   Social Determinants of Health   Financial Resource  Strain:   . Difficulty of Paying Living Expenses: Not on file  Food Insecurity:   . Worried About Programme researcher, broadcasting/film/video in the Last Year: Not on file  . Ran Out of Food in the Last Year: Not on file  Transportation Needs:   . Lack of Transportation (Medical): Not on file  . Lack of Transportation (Non-Medical): Not on file  Physical Activity:   . Days of Exercise per Week: Not on file  . Minutes of Exercise per Session: Not on file  Stress:   . Feeling of Stress : Not on file  Social Connections:   . Frequency of Communication with Friends and Family: Not on file  . Frequency of Social Gatherings with Friends and Family: Not on file  . Attends Religious Services: Not on file  . Active Member of Clubs or Organizations: Not on file  . Attends Banker Meetings: Not on file  . Marital Status: Not on file    Additional Social History: Patient resides in [place]. She has been a relationship for 4 years. She has one 44 year old daughter. She currently works at [employer]. She endorses smoking a half a pack of cigarettes a day. She denies alcohol and illegal drug use. She notes that she occasional drinks socially.   Allergies:  No Known Allergies  Metabolic Disorder Labs: No results found for: HGBA1C, MPG No results found for: PROLACTIN No results found for: CHOL, TRIG, HDL, CHOLHDL, VLDL, LDLCALC No results found for: TSH  Therapeutic Level Labs: No results found for: LITHIUM No results found for: CBMZ No results found for: VALPROATE  Current Medications: Current Outpatient Medications  Medication Sig Dispense Refill  . docusate sodium (COLACE) 100 MG capsule Take 1 capsule (100 mg total) by mouth 2 (two) times daily. 60 capsule 0  . ibuprofen (ADVIL,MOTRIN) 600 MG tablet Take 1 tablet (600 mg total) by mouth every 6 (six) hours as needed. 90 tablet 0  . oxyCODONE-acetaminophen (ROXICET) 5-325 MG per tablet Take 2 tablets by mouth every 4 (four) hours as needed. May take 1-2  tablets every 4-6 hours as needed for pain 30 tablet 0  . permethrin (ELIMITE) 5 % cream Apply to affected area once from the neck down and leave on overnight. 60 g 0  . Prenatal Vit-Fe Fumarate-FA (PRENATAL MULTIVITAMIN) TABS tablet Take 1 tablet by mouth daily at 12 noon.    . risperiDONE (RISPERDAL) 2 MG tablet Take 1 tablet (2 mg total) by mouth 2 (two) times daily. 60 tablet 2  . triamcinolone cream (KENALOG) 0.1 % Apply 1 application topically 2 (two) times daily. 30 g 0   No current facility-administered medications for this visit.    Musculoskeletal: Strength & Muscle Tone: Unable to assess due to telehealth visit Gait & Station: Unable to assess due to telehealth visit Patient leans: N/A  Psychiatric Specialty Exam: Review of Systems  unknown if  currently breastfeeding.There is no height or weight on file to calculate BMI.  General Appearance: Well Groomed  Eye Contact:  Good  Speech:  Clear and Coherent and Normal Rate  Volume:  Normal  Mood:  Euthymic  Affect:  Congruent  Thought Process:  Coherent, Goal Directed and Linear  Orientation:  Full (Time, Place, and Person)  Thought Content:  WDL and Logical  Suicidal Thoughts:  No  Homicidal Thoughts:  No  Memory:  Immediate;   Good Recent;   Good Remote;   Good  Judgement:  Good  Insight:  Good  Psychomotor Activity:  Normal  Concentration:  Concentration: Good and Attention Span: Good  Recall:  Good  Fund of Knowledge:Good  Language: Good  Akathisia:  No  Handed:  Right  AIMS (if indicated): Not done  Assets:  Communication Skills Desire for Improvement Financial Resources/Insurance Housing Intimacy Social Support  ADL's:  Intact  Cognition: WNL  Sleep:  Good   Screenings:   Assessment and Plan: Patient notes that overall she is doing well on her current medication regimen.  No medication adjustments made today.  Patient is agreeable to continue all medications as prescribed.  1. Bipolar I disorder  (HCC)  Continue- risperiDONE (RISPERDAL) 2 MG tablet; Take 1 tablet (2 mg total) by mouth 2 (two) times daily.  Dispense: 60 tablet; Refill: 2   Follow-up in 3 months   Shanna Cisco, NP 9/22/20219:51 AM

## 2020-11-29 ENCOUNTER — Telehealth (HOSPITAL_COMMUNITY): Payer: No Payment, Other | Admitting: Psychiatry

## 2020-11-29 ENCOUNTER — Telehealth (HOSPITAL_COMMUNITY): Payer: Self-pay | Admitting: Psychiatry

## 2020-12-05 ENCOUNTER — Other Ambulatory Visit (HOSPITAL_COMMUNITY): Payer: Self-pay | Admitting: Psychiatry

## 2020-12-05 DIAGNOSIS — F319 Bipolar disorder, unspecified: Secondary | ICD-10-CM

## 2020-12-05 MED ORDER — RISPERIDONE 2 MG PO TABS: 2.0000 mg | ORAL_TABLET | Freq: Two times a day (BID) | ORAL | 2 refills | Status: DC

## 2020-12-05 NOTE — Telephone Encounter (Signed)
Medication refilled and sent to preferred pharmacy

## 2020-12-11 ENCOUNTER — Encounter (HOSPITAL_COMMUNITY): Payer: Self-pay | Admitting: Emergency Medicine

## 2020-12-11 ENCOUNTER — Ambulatory Visit (INDEPENDENT_AMBULATORY_CARE_PROVIDER_SITE_OTHER): Payer: Medicaid Other

## 2020-12-11 ENCOUNTER — Ambulatory Visit (HOSPITAL_COMMUNITY)
Admission: EM | Admit: 2020-12-11 | Discharge: 2020-12-11 | Disposition: A | Discharge status: Home / Self Care | Payer: Medicaid Other | Attending: Urgent Care | Admitting: Urgent Care

## 2020-12-11 ENCOUNTER — Other Ambulatory Visit: Payer: Self-pay

## 2020-12-11 DIAGNOSIS — S92911A Unspecified fracture of right toe(s), initial encounter for closed fracture: Secondary | ICD-10-CM

## 2020-12-11 DIAGNOSIS — M79674 Pain in right toe(s): Secondary | ICD-10-CM

## 2020-12-11 DIAGNOSIS — M79671 Pain in right foot: Secondary | ICD-10-CM

## 2020-12-11 DIAGNOSIS — S92514A Nondisplaced fracture of proximal phalanx of right lesser toe(s), initial encounter for closed fracture: Secondary | ICD-10-CM

## 2020-12-11 MED ORDER — HYDROCODONE-ACETAMINOPHEN 5-325 MG PO TABS: 1.0000 | ORAL_TABLET | Freq: Four times a day (QID) | ORAL | 0 refills | Status: DC | PRN

## 2020-12-11 MED ORDER — NAPROXEN 500 MG PO TABS: 500.0000 mg | ORAL_TABLET | Freq: Two times a day (BID) | ORAL | 0 refills | Status: DC

## 2020-12-11 NOTE — ED Triage Notes (Signed)
Pt states that she hit her pinky toe on the christmas tree. Pt states that she noticed swelling and bruising. Pt states that this incident happened two days ago.

## 2020-12-11 NOTE — ED Provider Notes (Signed)
[facility]   MRN: [id number] DOB: 1986-07-08  Subjective:   Melinda Baker is a 35 y.o. female presenting for 2-day history of acute onset right pinky toe pain that extends into her foot.  Patient states that she accidentally stubbed her foot against a chair near her Christmas tree.  She has since had persistent pain and swelling. Has had some mild improvement in her symptoms. Would like to make sure she does not have a fracture.   No current facility-administered medications for this encounter.  Current Outpatient Medications:  .  risperiDONE (RISPERDAL) 2 MG tablet, Take 1 tablet (2 mg total) by mouth 2 (two) times daily., Disp: 60 tablet, Rfl: 2 .  docusate sodium (COLACE) 100 MG capsule, Take 1 capsule (100 mg total) by mouth 2 (two) times daily., Disp: 60 capsule, Rfl: 0 .  ibuprofen (ADVIL,MOTRIN) 600 MG tablet, Take 1 tablet (600 mg total) by mouth every 6 (six) hours as needed., Disp: 90 tablet, Rfl: 0 .  oxyCODONE-acetaminophen (ROXICET) 5-325 MG per tablet, Take 2 tablets by mouth every 4 (four) hours as needed. May take 1-2 tablets every 4-6 hours as needed for pain, Disp: 30 tablet, Rfl: 0 .  permethrin (ELIMITE) 5 % cream, Apply to affected area once from the neck down and leave on overnight., Disp: 60 g, Rfl: 0 .  Prenatal Vit-Fe Fumarate-FA (PRENATAL MULTIVITAMIN) TABS tablet, Take 1 tablet by mouth daily at 12 noon., Disp: , Rfl:  .  triamcinolone cream (KENALOG) 0.1 %, Apply 1 application topically 2 (two) times daily., Disp: 30 g, Rfl: 0   No Known Allergies  Past Medical History:  Diagnosis Date  . Allergy   . Asthma   . Bipolar 1 disorder [facility]      Past Surgical History:  Procedure Laterality Date  . DILATION AND CURETTAGE OF UTERUS      Family History  Problem Relation Age of Onset  . Diabetes Mother   . Stroke Mother        TIA  . Diabetes Father   . Cancer Paternal Aunt   . Diabetes Maternal Grandmother     Social History    Tobacco Use  . Smoking status: Current Every Day Smoker    Packs/day: 0.50    Years: 5.00    Pack years: 2.50    Types: Cigarettes  . Smokeless tobacco: Never Used  . Tobacco comment: e-cig, trying to "do better"  Substance Use Topics  . Alcohol use: Yes    Comment: once a week.  . Drug use: No    Comment: hx of ectasy, marijuana use     ROS   Objective:   Vitals: BP 133/88 (BP Location: Right Arm)   Pulse 93   Temp 98.2 F (36.8 C) (Oral)   Resp 18   LMP 11/20/2020 (Approximate)   SpO2 100%   Breastfeeding No   Physical Exam Constitutional:      General: She is not in acute distress.    Appearance: Normal appearance. She is well-developed. She is not ill-appearing, toxic-appearing or diaphoretic.  HENT:     Head: Normocephalic and atraumatic.     Nose: Nose normal.     Mouth/Throat:     Mouth: Mucous membranes are moist.     Pharynx: Oropharynx is clear.  Eyes:     General: No scleral icterus.       Right eye: No discharge.        Left eye: No discharge.  Extraocular Movements: Extraocular movements intact.     Conjunctiva/sclera: Conjunctivae normal.     Pupils: Pupils are equal, round, and reactive to light.  Cardiovascular:     Rate and Rhythm: Normal rate.  Pulmonary:     Effort: Pulmonary effort is normal.  Musculoskeletal:       Feet:  Skin:    General: Skin is warm and dry.  Neurological:     General: No focal deficit present.     Mental Status: She is alert and oriented to person, place, and time.     Motor: No weakness.     Coordination: Coordination normal.     Gait: Gait normal.     Deep Tendon Reflexes: Reflexes normal.  Psychiatric:        Mood and Affect: Mood normal.        Behavior: Behavior normal.        Thought Content: Thought content normal.        Judgment: Judgment normal.     DG Foot Complete Right  Result Date: 12/11/2020 CLINICAL DATA:  Right foot injury EXAM: RIGHT FOOT COMPLETE - 3+ VIEW COMPARISON:  None.  FINDINGS: Acute nondisplaced fracture of the mid diaphysis of the fifth toe proximal phalanx. Osseous structures appear otherwise intact. No additional fractures. No dislocation. Soft tissues are within normal limits. IMPRESSION: Acute nondisplaced fracture of the fifth toe proximal phalanx. Electronically Signed   By: Duanne Guess D.O.   On: 12/11/2020 15:42     Assessment and Plan :   PDMP not reviewed this encounter.  1. Unspecified fracture of right toe(s), initial encounter for closed fracture   2. Toe pain, right   3. Right foot pain     Recommended postop shoe for management of her fracture.  Schedule naproxen, use hydrocodone for breakthrough pain.  Follow-up with podiatry. Counseled patient on potential for adverse effects with medications prescribed/recommended today, ER and return-to-clinic precautions discussed, patient verbalized understanding.    Wallis Bamberg, PA-C 12/11/20 1553

## 2020-12-11 NOTE — Discharge Instructions (Signed)
[facility] ([place]) Podiatrist in [place], [place] COVID-19 info: [email or web address] Get online care: [email or web address] Address: [address], [place], [place] [address] Phone: [phone] Appointments: [email or web address]   [facility], [place], [place] Doctor in [place], [place] Address: [address], [place], [place] [address] Phone: [phone]  

## 2021-01-02 ENCOUNTER — Telehealth (HOSPITAL_COMMUNITY): Payer: No Payment, Other | Admitting: Psychiatry

## 2021-01-04 ENCOUNTER — Ambulatory Visit: Payer: Medicaid Other

## 2021-01-22 ENCOUNTER — Other Ambulatory Visit: Payer: Self-pay

## 2021-01-22 ENCOUNTER — Telehealth (HOSPITAL_COMMUNITY): Payer: No Payment, Other | Admitting: Psychiatry

## 2021-06-24 ENCOUNTER — Other Ambulatory Visit (HOSPITAL_COMMUNITY): Payer: Self-pay | Admitting: Psychiatry

## 2021-06-24 DIAGNOSIS — F319 Bipolar disorder, unspecified: Secondary | ICD-10-CM

## 2021-08-18 ENCOUNTER — Other Ambulatory Visit (HOSPITAL_COMMUNITY): Payer: Self-pay | Admitting: Psychiatry

## 2021-08-18 DIAGNOSIS — F319 Bipolar disorder, unspecified: Secondary | ICD-10-CM

## 2021-10-17 ENCOUNTER — Other Ambulatory Visit (HOSPITAL_COMMUNITY): Payer: Self-pay | Admitting: Psychiatry

## 2021-10-17 DIAGNOSIS — F319 Bipolar disorder, unspecified: Secondary | ICD-10-CM

## 2021-10-18 ENCOUNTER — Other Ambulatory Visit (HOSPITAL_COMMUNITY): Payer: Self-pay | Admitting: Psychiatry

## 2021-10-18 DIAGNOSIS — F319 Bipolar disorder, unspecified: Secondary | ICD-10-CM

## 2021-12-04 IMAGING — DX DG FOOT COMPLETE 3+V*R*
3 series · 3 of 3 positions shown · non-contrast
Comparison: None.

CLINICAL DATA: Right foot injury

EXAM:
RIGHT FOOT COMPLETE - 3+ VIEW

[foot ap]
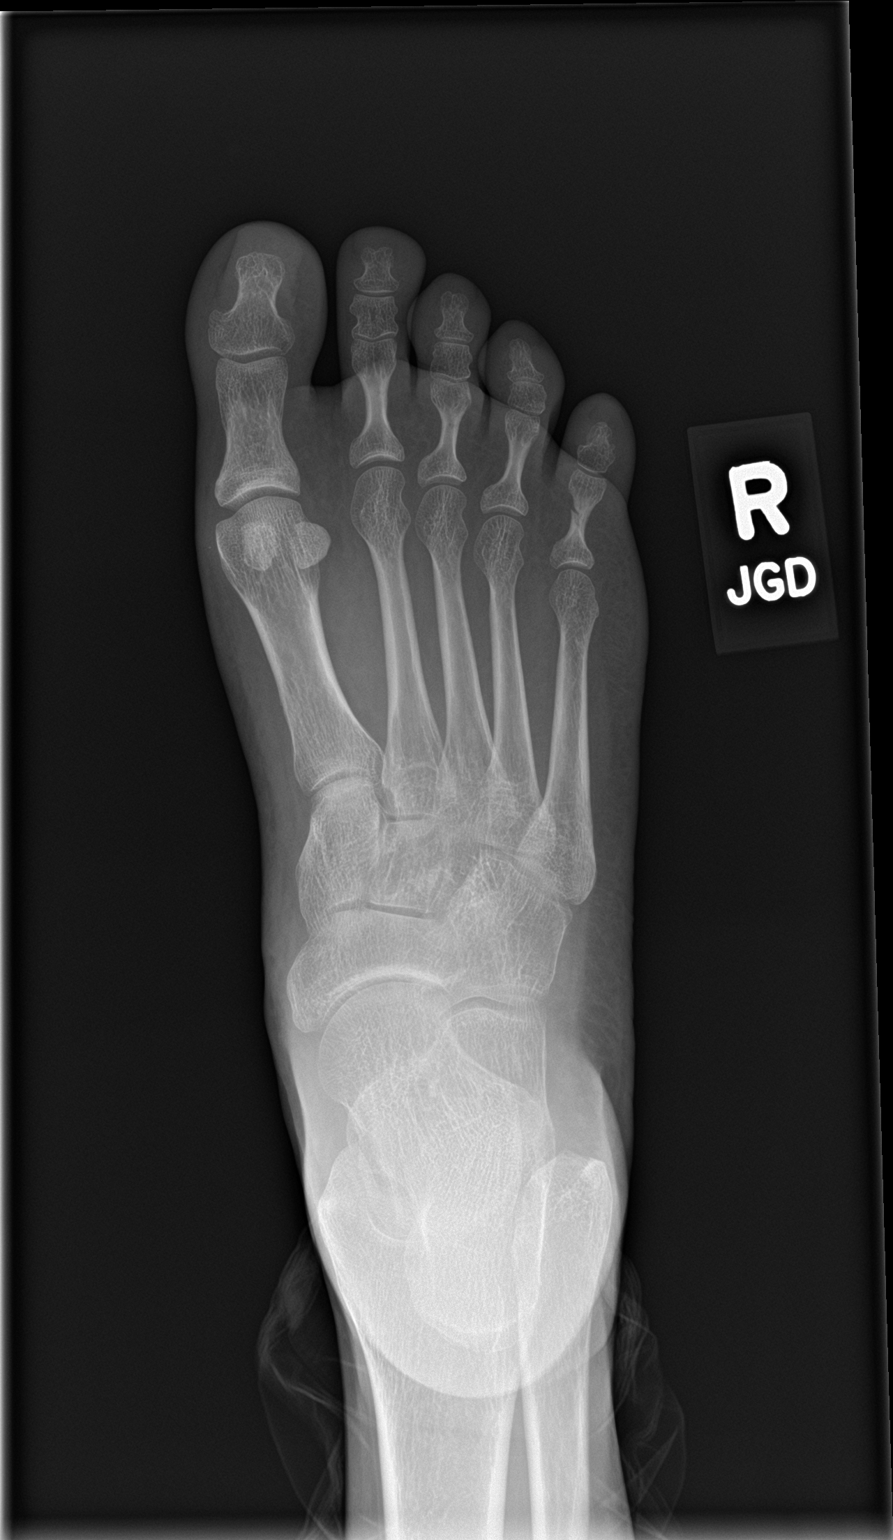

[foot obl]
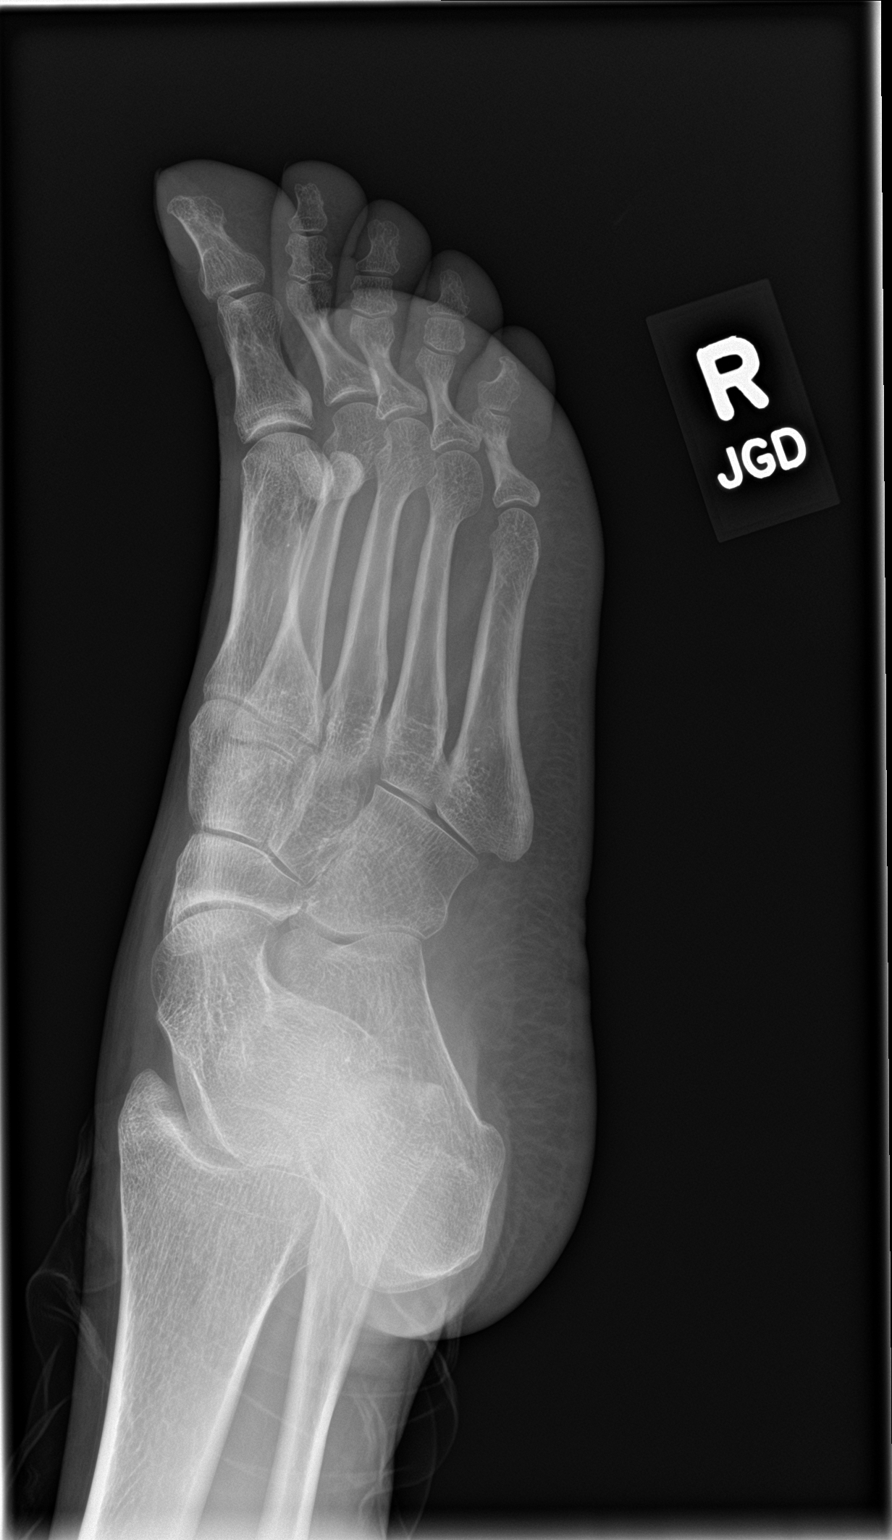

[foot lat]
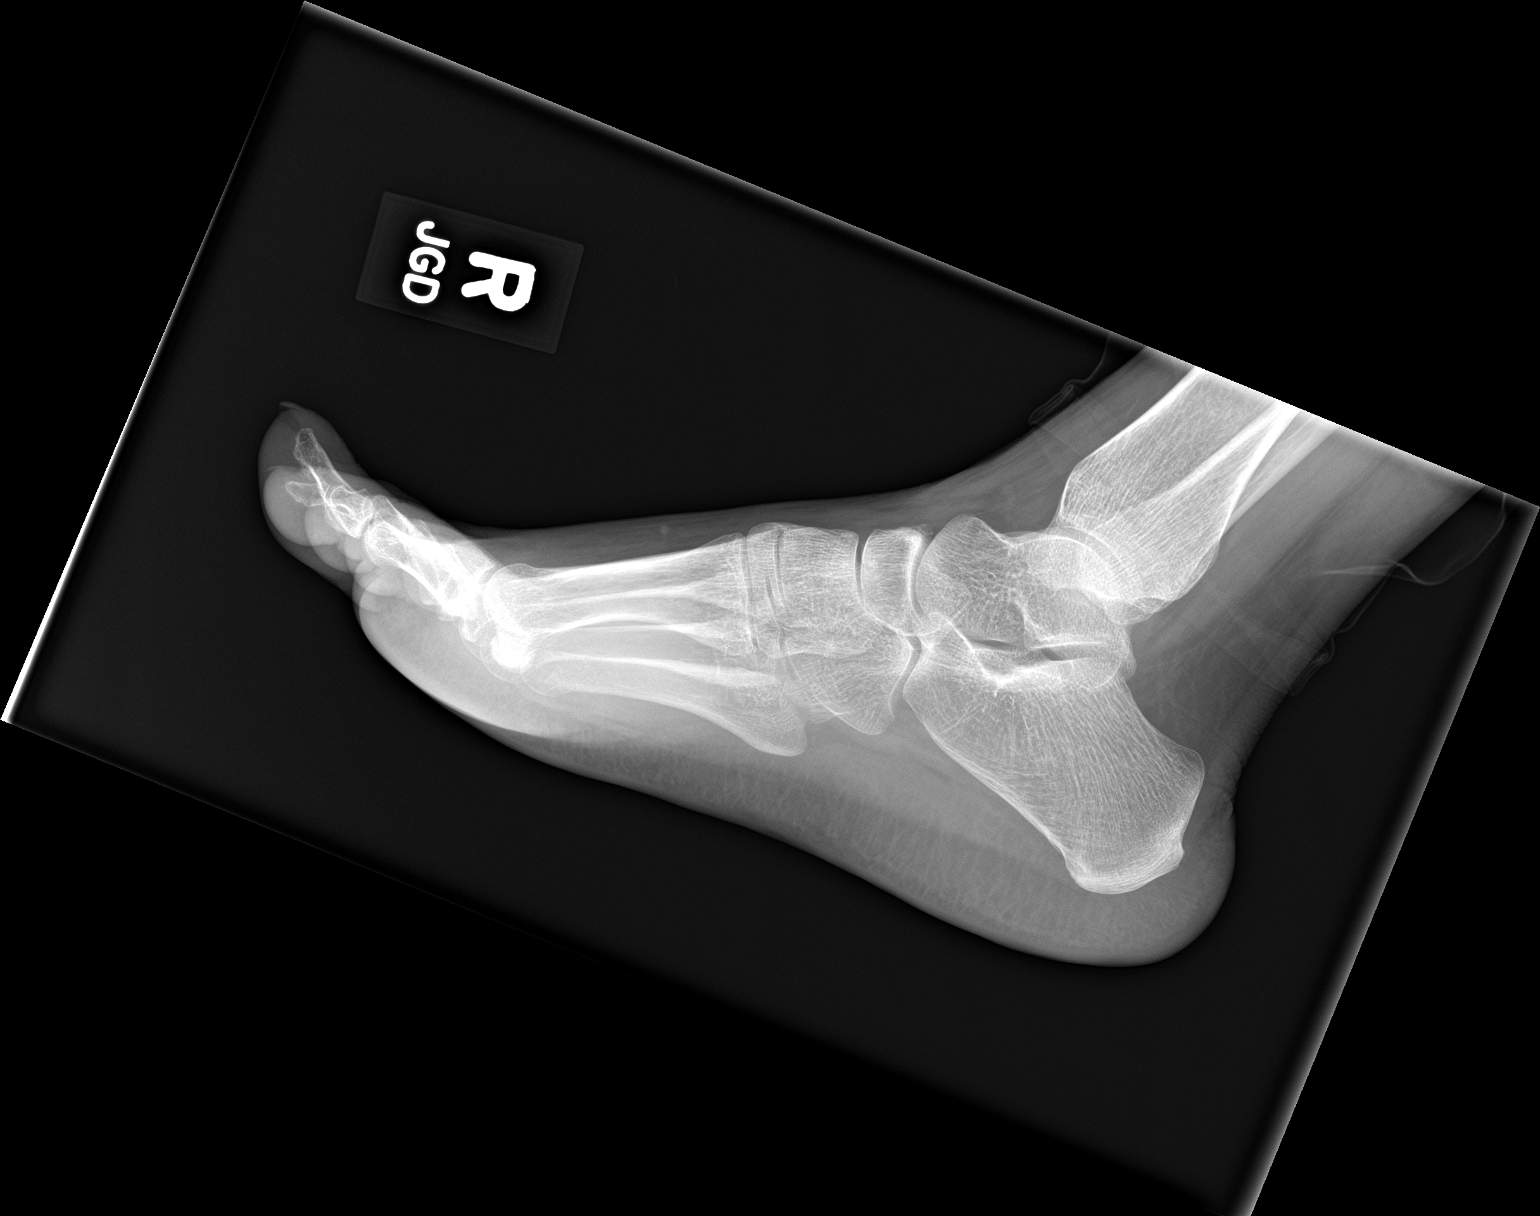

[3 of 3 positions shown; findings below may reference images not displayed]

FINDINGS: Acute nondisplaced fracture of the mid diaphysis of the fifth toe
proximal phalanx. Osseous structures appear otherwise intact. No
additional fractures. No dislocation. Soft tissues are within normal
limits.
IMPRESSION: Acute nondisplaced fracture of the fifth toe proximal phalanx.

## 2021-12-17 ENCOUNTER — Other Ambulatory Visit (HOSPITAL_COMMUNITY): Payer: Self-pay | Admitting: Psychiatry

## 2021-12-17 DIAGNOSIS — F319 Bipolar disorder, unspecified: Secondary | ICD-10-CM

## 2022-01-15 ENCOUNTER — Other Ambulatory Visit (HOSPITAL_COMMUNITY): Payer: Self-pay | Admitting: Psychiatry

## 2022-01-15 DIAGNOSIS — F319 Bipolar disorder, unspecified: Secondary | ICD-10-CM

## 2022-02-20 ENCOUNTER — Other Ambulatory Visit (HOSPITAL_COMMUNITY): Payer: Self-pay | Admitting: Psychiatry

## 2022-02-20 DIAGNOSIS — F319 Bipolar disorder, unspecified: Secondary | ICD-10-CM

## 2022-02-22 ENCOUNTER — Telehealth (HOSPITAL_COMMUNITY): Payer: Self-pay

## 2022-02-22 NOTE — Telephone Encounter (Signed)
Patient called requesting a refill on her Risperidone 2mg  to be sent to on [address] in [place]. Please review and advise. Thank you ?

## 2022-02-25 ENCOUNTER — Telehealth (HOSPITAL_COMMUNITY): Payer: Self-pay

## 2022-02-25 NOTE — Telephone Encounter (Signed)
error 

## 2022-02-25 NOTE — Telephone Encounter (Signed)
Patient called again regarding her Risperidone 2mg . Pt has a followup appointment in 2 days on 02/27/22. Reminded pt of her appointment and recommended that she get something oer the counter for a couple days or see if the pharmacy will provide a couple tablets to get her through to her appointment ?

## 2022-02-25 NOTE — Telephone Encounter (Signed)
Pt has scheduled virtual visit with C. Penn NP on 02/27/22. Request a few pills Risperdone until appt. New ph [phone]

## 2022-02-27 ENCOUNTER — Telehealth (INDEPENDENT_AMBULATORY_CARE_PROVIDER_SITE_OTHER): Payer: No Payment, Other | Admitting: Psychiatry

## 2022-02-27 DIAGNOSIS — F319 Bipolar disorder, unspecified: Secondary | ICD-10-CM | POA: Diagnosis not present

## 2022-02-27 MED ORDER — RISPERIDONE 2 MG PO TABS: 2.0000 mg | ORAL_TABLET | Freq: Two times a day (BID) | ORAL | 2 refills | Status: DC

## 2022-02-27 NOTE — Progress Notes (Signed)
BH MD/PA/NP OP Progress Note ? ?02/27/2022 8:10 AM ?Melinda Baker  ?MRN:  [id number] ? ?Virtual Visit via Telephone Note ? ?I connected with Melinda Baker on 02/27/22 at  8:00 AM EDT by telephone and verified that I am speaking with the correct person using two identifiers. ? ?Location: ?Patient: home ?Provider: off site ?  ?I discussed the limitations, risks, security and privacy concerns of performing an evaluation and management service by telephone and the availability of in person appointments. I also discussed with the patient that there may be a patient responsible charge related to this service. The patient expressed understanding and agreed to proceed. ? ?  ?I discussed the assessment and treatment plan with the patient. The patient was provided an opportunity to ask questions and all were answered. The patient agreed with the plan and demonstrated an understanding of the instructions. ?  ?The patient was advised to call back or seek an in-person evaluation if the symptoms worsen or if the condition fails to improve as anticipated. ? ?I provided 10 minutes of non-face-to-face time during this encounter. ? ? ?Mcneil Sober, NP  ? ?Chief Complaint: No chief complaint on file. ? ?HPI: Melinda Baker is a 36 year old female presenting to [facility] behavioral health outpatient for psychiatric follow-up evaluation.  She has a psychiatric history of bipolar disorder and her symptoms are managed with Risperdal 2 mg twice daily.  Patient reports medication compliance and denies adverse effects.  Patient denies the need for dosage adjustment today and reports medications are effective with managing symptoms.  No medication changes today. ?Patient is alert and oriented x4, calm, pleasant and willing to engage.  She reports good moods, sleep, appetite.  Patient denies suicidal or homicidal ideations, paranoia, delusional thought, auditory or visual hallucinations. ? ?Visit Diagnosis: No diagnosis  found. ? ?Past Psychiatric History: Bipolar disorder ? ?Past Medical History:  ?Past Medical History:  ?Diagnosis Date  ? Allergy   ? Asthma   ? Bipolar 1 disorder (HCC)   ?  ?Past Surgical History:  ?Procedure Laterality Date  ? DILATION AND CURETTAGE OF UTERUS    ? ? ?Family Psychiatric History: None known ? ?Family History:  ?Family History  ?Problem Relation Age of Onset  ? Diabetes Mother   ? Stroke Mother   ?     TIA  ? Diabetes Father   ? Cancer Paternal Aunt   ? Diabetes Maternal Grandmother   ? ? ?Social History:  ?Social History  ? ?Socioeconomic History  ? Marital status: Single  ?  Spouse name: Not on file  ? Number of children: Not on file  ? Years of education: Not on file  ? Highest education level: Not on file  ?Occupational History  ?  Comment: server, [employer]  ?Tobacco Use  ? Smoking status: Every Day  ?  Packs/day: 0.50  ?  Years: 5.00  ?  Pack years: 2.50  ?  Types: Cigarettes  ? Smokeless tobacco: Never  ? Tobacco comments:  ?  e-cig, trying to "do better"  ?Substance and Sexual Activity  ? Alcohol use: Yes  ?  Comment: once a week.  ? Drug use: No  ?  Comment: hx of ectasy, marijuana use   ? Sexual activity: Yes  ?  Partners: Male  ?  Birth control/protection: Condom  ?Other Topics Concern  ? Not on file  ?Social History Narrative  ? Lives with roommate.  [name].  ? ?Social Determinants of Health  ? ?  Financial Resource Strain: Not on file  ?Food Insecurity: Not on file  ?Transportation Needs: Not on file  ?Physical Activity: Not on file  ?Stress: Not on file  ?Social Connections: Not on file  ? ? ?Allergies: No Known Allergies ? ?Metabolic Disorder Labs: ?No results found for: HGBA1C, MPG ?No results found for: PROLACTIN ?No results found for: CHOL, TRIG, HDL, CHOLHDL, VLDL, LDLCALC ?No results found for: TSH ? ?Therapeutic Level Labs: ?No results found for: LITHIUM ?No results found for: VALPROATE ?No components found for:  CBMZ ? ?Current Medications: ?Current Outpatient Medications   ?Medication Sig Dispense Refill  ? docusate sodium (COLACE) 100 MG capsule Take 1 capsule (100 mg total) by mouth 2 (two) times daily. 60 capsule 0  ? HYDROcodone-acetaminophen (NORCO/VICODIN) 5-325 MG tablet Take 1 tablet by mouth every 6 (six) hours as needed for severe pain. 10 tablet 0  ? ibuprofen (ADVIL,MOTRIN) 600 MG tablet Take 1 tablet (600 mg total) by mouth every 6 (six) hours as needed. 90 tablet 0  ? naproxen (NAPROSYN) 500 MG tablet Take 1 tablet (500 mg total) by mouth 2 (two) times daily with a meal. 30 tablet 0  ? oxyCODONE-acetaminophen (ROXICET) 5-325 MG per tablet Take 2 tablets by mouth every 4 (four) hours as needed. May take 1-2 tablets every 4-6 hours as needed for pain 30 tablet 0  ? permethrin (ELIMITE) 5 % cream Apply to affected area once from the neck down and leave on overnight. 60 g 0  ? Prenatal Vit-Fe Fumarate-FA (PRENATAL MULTIVITAMIN) TABS tablet Take 1 tablet by mouth daily at 12 noon.    ? risperiDONE (RISPERDAL) 2 MG tablet Take 1 tablet by mouth twice daily 60 tablet 0  ? triamcinolone cream (KENALOG) 0.1 % Apply 1 application topically 2 (two) times daily. 30 g 0  ? ?No current facility-administered medications for this visit.  ? ? ? ?Musculoskeletal: ?Strength & Muscle Tone: N/A virtual visit ?Gait & Station: N/A virtual visit ?Patient leans: N/A ? ?Psychiatric Specialty Exam: ?Review of Systems  ?Psychiatric/Behavioral:  Negative for hallucinations, self-injury and suicidal ideas.   ?All other systems reviewed and are negative.  ?There were no vitals taken for this visit.There is no height or weight on file to calculate BMI.  ?General Appearance: NA  ?Eye Contact:  NA  ?Speech:  Clear and Coherent  ?Volume:  Normal  ?Mood:  Euthymic  ?Affect:  NA  ?Thought Process:  Goal Directed  ?Orientation:  Full (Time, Place, and Person)  ?Thought Content: Logical   ?Suicidal Thoughts:  No  ?Homicidal Thoughts:  No  ?Memory: Good  ?Judgement:  Good  ?Insight:  Good  ?Psychomotor  Activity:  NA  ?Concentration: Good  ?Recall: Good  ?Fund of Knowledge: Good  ?Language: Good  ?Akathisia: N/A  ?Handed: Right  ?AIMS (if indicated): Not done  ?Assets:  Communication Skills ?Desire for Improvement  ?ADL's:  Intact  ?Cognition: WNL  ?Sleep:  Good  ? ?Screenings: ? ? ?Assessment and Plan: Katura L. Effie ShyWentz is a 36 year old female [facility] follow-up evaluation.  She has a psychiatric history of bipolar disorder and her symptoms are managed with Risperdal 2 mg twice daily.  Patient reports medication compliance and denies adverse effects.  Patient denies the need for dosage adjustment today and reports medications are effective with managing symptoms.  No medication changes today.  Risperdal 2 mg twice daily ordered. ? ?Collaboration of Care: Collaboration of Care: Medication Management AEB medications E  scribed to patient's preferred pharmacy. ? ?1. Bipolar I disorder (HCC) ? ?- risperiDONE (RISPERDAL) 2 MG tablet; Take 1 tablet (2 mg total) by mouth 2 (two) times daily.  Dispense: 60 tablet; Refill: 2  ? ? ?Return to care in 3 months ? ?Patient/Guardian was advised Release of Information must be obtained prior to any record release in order to collaborate their care with an outside provider. Patient/Guardian was advised if they have not already done so to contact the registration department to sign all necessary forms in order for [place] to release information regarding their care.  ? ?Consent: Patient/Guardian gives verbal consent for treatment and assignment of benefits for services provided during this visit. Patient/Guardian expressed understanding and agreed to proceed.  ? ? ?Mcneil Sober, NP ?02/27/2022, 8:10 AM ? ?

## 2022-04-27 ENCOUNTER — Other Ambulatory Visit (HOSPITAL_COMMUNITY): Payer: Self-pay | Admitting: Psychiatry

## 2022-04-27 DIAGNOSIS — F319 Bipolar disorder, unspecified: Secondary | ICD-10-CM

## 2022-04-29 NOTE — Telephone Encounter (Signed)
Medication refilled and sent to preferred pharmacy

## 2022-05-29 ENCOUNTER — Telehealth (INDEPENDENT_AMBULATORY_CARE_PROVIDER_SITE_OTHER): Payer: No Payment, Other | Admitting: Psychiatry

## 2022-05-29 ENCOUNTER — Encounter (HOSPITAL_COMMUNITY): Payer: Self-pay | Admitting: Psychiatry

## 2022-05-29 DIAGNOSIS — F319 Bipolar disorder, unspecified: Secondary | ICD-10-CM

## 2022-05-29 MED ORDER — RISPERIDONE 2 MG PO TABS: 2.0000 mg | ORAL_TABLET | Freq: Two times a day (BID) | ORAL | 3 refills | Status: DC

## 2022-05-29 NOTE — Progress Notes (Signed)
BH MD/PA/NP OP Progress Note Virtual Visit via Telephone Note  I connected with Melinda Baker on 05/29/22 at  9:00 AM EDT by telephone and verified that I am speaking with the correct person using two identifiers.  Location: Patient: home Provider: Clinic   I discussed the limitations, risks, security and privacy concerns of performing an evaluation and management service by telephone and the availability of in person appointments. I also discussed with the patient that there may be a patient responsible charge related to this service. The patient expressed understanding and agreed to proceed.   I provided 20 minutes of non-face-to-face time during this encounter.  05/29/2022 8:52 AM Melinda Baker  MRN:  [id number]  Chief Complaint: "Im doing really good" HPI: 36 year old female seen today for follow-up psychiatric evaluation.She has a psychiatric history of bipolar 1 disorder.  She is currently being managed on Risperdal 2 mg twice daily.  She notes that her medications are effective in managing her psychiatric condition.   Today she was unable to logon virtually so her assessment was done over the phone.  During exam she was pleasant, cooperative, and engaged in conversation.  She informed Clinical research associate that she is doing really good.  She notes that she was started a new job next week at [employer].  She reports her mood is stable and notes that she has minimal anxiety and depression.  Patient notes that she has limited time to do a visit today so a GAD-7 and PHQ-9 was not conducted.  She endorses adequate sleep and appetite.  Today she denies SI/HI/VAH, mania, paranoia  No medication adjustments made today.  Patient is agreeable to continue all medications as prescribed.  She will follow-up with outpatient provider in 3 months for reevaluation.  No other concerns noted at this time Visit Diagnosis: No diagnosis found.  Past Psychiatric History: Bipolar 1  Past Medical History:  Past  Medical History:  Diagnosis Date   Allergy    Asthma    Bipolar 1 disorder (HCC)     Past Surgical History:  Procedure Laterality Date   DILATION AND CURETTAGE OF UTERUS      Family Psychiatric History: Unknown  Family History:  Family History  Problem Relation Age of Onset   Diabetes Mother    Stroke Mother        TIA   Diabetes Father    Cancer Paternal Aunt    Diabetes Maternal Grandmother     Social History:  Social History   Socioeconomic History   Marital status: Single    Spouse name: Not on file   Number of children: Not on file   Years of education: Not on file   Highest education level: Not on file  Occupational History    Comment: server, [employer]  Tobacco Use   Smoking status: Every Day    Packs/day: 0.50    Years: 5.00    Total pack years: 2.50    Types: Cigarettes   Smokeless tobacco: Never   Tobacco comments:    e-cig, trying to "do better"  Substance and Sexual Activity   Alcohol use: Yes    Comment: once a week.   Drug use: No    Comment: hx of ectasy, marijuana use    Sexual activity: Yes    Partners: Male    Birth control/protection: Condom  Other Topics Concern   Not on file  Social History Narrative   Lives with roommate.  [name].   Social Determinants of  Health   Financial Resource Strain: Not on file  Food Insecurity: Not on file  Transportation Needs: Not on file  Physical Activity: Not on file  Stress: Not on file  Social Connections: Not on file    Allergies: No Known Allergies  Metabolic Disorder Labs: No results found for: "HGBA1C", "MPG" No results found for: "PROLACTIN" No results found for: "CHOL", "TRIG", "HDL", "CHOLHDL", "VLDL", "LDLCALC" No results found for: "TSH"  Therapeutic Level Labs: No results found for: "LITHIUM" No results found for: "VALPROATE" No results found for: "CBMZ"  Current Medications: Current Outpatient Medications  Medication Sig Dispense Refill   risperiDONE (RISPERDAL) 2  MG tablet Take 1 tablet by mouth twice daily 60 tablet 0   docusate sodium (COLACE) 100 MG capsule Take 1 capsule (100 mg total) by mouth 2 (two) times daily. 60 capsule 0   HYDROcodone-acetaminophen (NORCO/VICODIN) 5-325 MG tablet Take 1 tablet by mouth every 6 (six) hours as needed for severe pain. 10 tablet 0   ibuprofen (ADVIL,MOTRIN) 600 MG tablet Take 1 tablet (600 mg total) by mouth every 6 (six) hours as needed. 90 tablet 0   naproxen (NAPROSYN) 500 MG tablet Take 1 tablet (500 mg total) by mouth 2 (two) times daily with a meal. 30 tablet 0   oxyCODONE-acetaminophen (ROXICET) 5-325 MG per tablet Take 2 tablets by mouth every 4 (four) hours as needed. May take 1-2 tablets every 4-6 hours as needed for pain 30 tablet 0   permethrin (ELIMITE) 5 % cream Apply to affected area once from the neck down and leave on overnight. 60 g 0   Prenatal Vit-Fe Fumarate-FA (PRENATAL MULTIVITAMIN) TABS tablet Take 1 tablet by mouth daily at 12 noon.     triamcinolone cream (KENALOG) 0.1 % Apply 1 application topically 2 (two) times daily. 30 g 0   No current facility-administered medications for this visit.     Musculoskeletal: Strength & Muscle Tone:  Unable to assess due to telephone visit Gait & Station:  Unable to assess due to telephone visit Patient leans: N/A  Psychiatric Specialty Exam: Review of Systems  There were no vitals taken for this visit.There is no height or weight on file to calculate BMI.  General Appearance:  Unable to assess due to telephone visit  Eye Contact:   Unable to assess due to telephone visit  Speech:  Clear and Coherent and Normal Rate  Volume:  Normal  Mood:  Euthymic  Affect:  Appropriate and Congruent  Thought Process:  Coherent, Goal Directed, and Linear  Orientation:  Full (Time, Place, and Person)  Thought Content: WDL and Logical   Suicidal Thoughts:  No  Homicidal Thoughts:  No  Memory:  Immediate;   Good Recent;   Good Remote;   Good  Judgement:   Good  Insight:  Good  Psychomotor Activity:   Unable to assess due to telephone visit  Concentration:  Concentration: Good and Attention Span: Good  Recall:  Good  Fund of Knowledge: Good  Language: Good  Akathisia:   Unable to assess due to telephone visit  Handed:  Right  AIMS (if indicated): not done  Assets:  Communication Skills Desire for Improvement Financial Resources/Insurance Clarissa Talents/Skills  ADL's:  Intact  Cognition: WNL  Sleep:  Good   Screenings:   Assessment and Plan: Patient notes that she is doing well on her current medication regimen.  No medication changes made today.  Patient agreeable to continue medication as prescribed.  1.  Bipolar I disorder (HCC)  Continue- risperiDONE (RISPERDAL) 2 MG tablet; Take 1 tablet (2 mg total) by mouth 2 (two) times daily.  Dispense: 60 tablet; Refill: 3   Collaboration of Care: Collaboration of Care: Other provider involved in patient's care AEB    Patient/Guardian was advised Release of Information must be obtained prior to any record release in order to collaborate their care with an outside provider. Patient/Guardian was advised if they have not already done so to contact the registration department to sign all necessary forms in order for [place] to release information regarding their care.   Consent: Patient/Guardian gives verbal consent for treatment and assignment of benefits for services provided during this visit. Patient/Guardian expressed understanding and agreed to proceed.    Shanna Cisco, NP 05/29/2022, 8:52 AM

## 2022-08-29 ENCOUNTER — Telehealth (HOSPITAL_COMMUNITY): Payer: No Payment, Other | Admitting: Psychiatry

## 2022-08-29 ENCOUNTER — Encounter (HOSPITAL_COMMUNITY): Payer: Self-pay

## 2023-04-01 ENCOUNTER — Telehealth (HOSPITAL_COMMUNITY): Payer: Self-pay | Admitting: *Deleted

## 2023-04-01 NOTE — Telephone Encounter (Signed)
Called [facility] [employer] spoke with Lupita Leash who gave approval of Risperidal  until 03/26/24. Auth #[id number]. Called to notify pharmacy.

## 2023-04-22 ENCOUNTER — Other Ambulatory Visit (HOSPITAL_COMMUNITY): Payer: Self-pay | Admitting: Psychiatry

## 2023-04-22 DIAGNOSIS — F319 Bipolar disorder, unspecified: Secondary | ICD-10-CM

## 2023-05-07 ENCOUNTER — Other Ambulatory Visit (HOSPITAL_COMMUNITY): Payer: Self-pay | Admitting: Psychiatry

## 2023-05-07 DIAGNOSIS — F319 Bipolar disorder, unspecified: Secondary | ICD-10-CM

## 2023-05-26 ENCOUNTER — Other Ambulatory Visit (HOSPITAL_COMMUNITY): Payer: Self-pay | Admitting: Psychiatry

## 2023-05-26 DIAGNOSIS — F319 Bipolar disorder, unspecified: Secondary | ICD-10-CM

## 2023-07-09 ENCOUNTER — Emergency Department (HOSPITAL_COMMUNITY)
Admission: EM | Admit: 2023-07-09 | Discharge: 2023-07-10 | Discharge status: Against Medical Advice | Payer: MEDICAID | Attending: Emergency Medicine | Admitting: Emergency Medicine

## 2023-07-09 ENCOUNTER — Encounter (HOSPITAL_COMMUNITY): Payer: Self-pay

## 2023-07-09 DIAGNOSIS — R197 Diarrhea, unspecified: Secondary | ICD-10-CM | POA: Insufficient documentation

## 2023-07-09 DIAGNOSIS — Z5321 Procedure and treatment not carried out due to patient leaving prior to being seen by health care provider: Secondary | ICD-10-CM | POA: Diagnosis not present

## 2023-07-09 DIAGNOSIS — N3001 Acute cystitis with hematuria: Secondary | ICD-10-CM | POA: Insufficient documentation

## 2023-07-09 DIAGNOSIS — R1031 Right lower quadrant pain: Secondary | ICD-10-CM | POA: Diagnosis not present

## 2023-07-09 LAB — URINALYSIS, ROUTINE W REFLEX MICROSCOPIC
Bilirubin Urine: NEGATIVE
Glucose, UA: NEGATIVE mg/dL
Ketones, ur: 20 mg/dL — AB
Nitrite: NEGATIVE
Protein, ur: NEGATIVE mg/dL
Specific Gravity, Urine: 1.012 (ref 1.005–1.030)
pH: 5 (ref 5.0–8.0)

## 2023-07-09 LAB — CBC
HCT: 43.6 % (ref 36.0–46.0)
Hemoglobin: 14.6 g/dL (ref 12.0–15.0)
MCH: 31 pg (ref 26.0–34.0)
MCHC: 33.5 g/dL (ref 30.0–36.0)
MCV: 92.6 fL (ref 80.0–100.0)
Platelets: 272 10*3/uL (ref 150–400)
RBC: 4.71 MIL/uL (ref 3.87–5.11)
RDW: 14.6 % (ref 11.5–15.5)
WBC: 18.1 10*3/uL — ABNORMAL HIGH (ref 4.0–10.5)
nRBC: 0 % (ref 0.0–0.2)

## 2023-07-09 LAB — COMPREHENSIVE METABOLIC PANEL
ALT: 15 U/L (ref 0–44)
AST: 16 U/L (ref 15–41)
Albumin: 3.6 g/dL (ref 3.5–5.0)
Alkaline Phosphatase: 70 U/L (ref 38–126)
Anion gap: 13 (ref 5–15)
BUN: 6 mg/dL (ref 6–20)
CO2: 20 mmol/L — ABNORMAL LOW (ref 22–32)
Calcium: 8.9 mg/dL (ref 8.9–10.3)
Chloride: 102 mmol/L (ref 98–111)
Creatinine, Ser: 0.7 mg/dL (ref 0.44–1.00)
GFR, Estimated: >60 mL/min (ref >60)
Glucose, Bld: 68 mg/dL — ABNORMAL LOW (ref 70–99)
Potassium: 3.1 mmol/L — ABNORMAL LOW (ref 3.5–5.1)
Sodium: 135 mmol/L (ref 135–145)
Total Bilirubin: 1.2 mg/dL (ref 0.3–1.2)
Total Protein: 7.5 g/dL (ref 6.5–8.1)

## 2023-07-09 LAB — LIPASE, BLOOD: Lipase: 23 U/L (ref 11–51)

## 2023-07-09 LAB — HCG, QUANTITATIVE, PREGNANCY: hCG, Beta Chain, Quant, S: 2 m[IU]/mL (ref <5)

## 2023-07-09 NOTE — ED Triage Notes (Signed)
Right lower abd pain that started this morning, has taken motrin for the pain, does report some loose stools. The pain worsens with movement and palpation. Pt does drink alcohol daily. No nausea, no vomiting, does not have an appetitie tho  Medic vitals   118/68 98% 84hr 17rr

## 2023-07-10 ENCOUNTER — Emergency Department (HOSPITAL_COMMUNITY)
Admission: EM | Admit: 2023-07-10 | Discharge: 2023-07-10 | Disposition: A | Discharge status: Home / Self Care | Payer: MEDICAID | Source: Home / Self Care | Attending: Emergency Medicine | Admitting: Emergency Medicine

## 2023-07-10 ENCOUNTER — Encounter (HOSPITAL_COMMUNITY): Payer: Self-pay

## 2023-07-10 ENCOUNTER — Emergency Department (HOSPITAL_COMMUNITY): Payer: MEDICAID

## 2023-07-10 ENCOUNTER — Other Ambulatory Visit: Payer: Self-pay

## 2023-07-10 DIAGNOSIS — N3001 Acute cystitis with hematuria: Secondary | ICD-10-CM | POA: Insufficient documentation

## 2023-07-10 DIAGNOSIS — N390 Urinary tract infection, site not specified: Secondary | ICD-10-CM

## 2023-07-10 DIAGNOSIS — R1084 Generalized abdominal pain: Secondary | ICD-10-CM

## 2023-07-10 LAB — COMPREHENSIVE METABOLIC PANEL
ALT: 15 U/L (ref 0–44)
AST: 19 U/L (ref 15–41)
Albumin: 3.8 g/dL (ref 3.5–5.0)
Alkaline Phosphatase: 84 U/L (ref 38–126)
Anion gap: 11 (ref 5–15)
BUN: 7 mg/dL (ref 6–20)
CO2: 24 mmol/L (ref 22–32)
Calcium: 9.3 mg/dL (ref 8.9–10.3)
Chloride: 98 mmol/L (ref 98–111)
Creatinine, Ser: 0.78 mg/dL (ref 0.44–1.00)
GFR, Estimated: >60 mL/min (ref >60)
Glucose, Bld: 82 mg/dL (ref 70–99)
Potassium: 3.8 mmol/L (ref 3.5–5.1)
Sodium: 133 mmol/L — ABNORMAL LOW (ref 135–145)
Total Bilirubin: 1.1 mg/dL (ref 0.3–1.2)
Total Protein: 8.3 g/dL — ABNORMAL HIGH (ref 6.5–8.1)

## 2023-07-10 LAB — URINALYSIS, ROUTINE W REFLEX MICROSCOPIC
Bilirubin Urine: NEGATIVE
Glucose, UA: NEGATIVE mg/dL
Ketones, ur: 20 mg/dL — AB
Nitrite: NEGATIVE
Protein, ur: 30 mg/dL — AB
Specific Gravity, Urine: 1.015 (ref 1.005–1.030)
pH: 5 (ref 5.0–8.0)

## 2023-07-10 LAB — CBC
HCT: 48.5 % — ABNORMAL HIGH (ref 36.0–46.0)
Hemoglobin: 16.2 g/dL — ABNORMAL HIGH (ref 12.0–15.0)
MCH: 30.9 pg (ref 26.0–34.0)
MCHC: 33.4 g/dL (ref 30.0–36.0)
MCV: 92.6 fL (ref 80.0–100.0)
Platelets: 290 10*3/uL (ref 150–400)
RBC: 5.24 MIL/uL — ABNORMAL HIGH (ref 3.87–5.11)
RDW: 14.6 % (ref 11.5–15.5)
WBC: 16.9 10*3/uL — ABNORMAL HIGH (ref 4.0–10.5)
nRBC: 0 % (ref 0.0–0.2)

## 2023-07-10 LAB — LIPASE, BLOOD: Lipase: 25 U/L (ref 11–51)

## 2023-07-10 LAB — HCG, SERUM, QUALITATIVE: Preg, Serum: NEGATIVE

## 2023-07-10 MED ORDER — SODIUM CHLORIDE 0.9 % IV SOLN
2.0000 g | Freq: Once | INTRAVENOUS | Status: AC
  Administered 2023-07-10: 2 g via INTRAVENOUS
  Filled 2023-07-10: qty 20

## 2023-07-10 MED ORDER — FENTANYL CITRATE PF 50 MCG/ML IJ SOSY
25.0000 ug | PREFILLED_SYRINGE | Freq: Once | INTRAMUSCULAR | Status: AC
  Administered 2023-07-10: 25 ug via INTRAVENOUS
  Filled 2023-07-10: qty 1

## 2023-07-10 MED ORDER — LACTATED RINGERS IV BOLUS
1000.0000 mL | Freq: Once | INTRAVENOUS | Status: AC
  Administered 2023-07-10: 1000 mL via INTRAVENOUS

## 2023-07-10 MED ORDER — IOHEXOL 350 MG/ML SOLN
75.0000 mL | Freq: Once | INTRAVENOUS | Status: AC | PRN
  Administered 2023-07-10: 75 mL via INTRAVENOUS

## 2023-07-10 NOTE — ED Notes (Signed)
Pt is a&ox4, ambulatory. Pt is complaining of generalized abdominal pain. Pt changed into a gown, attached to monitor/vitals. Side rails up x 2, call light with patient and mother at the bedside.

## 2023-07-10 NOTE — Discharge Instructions (Signed)
Treated in the emergency department for abdominal pain.  You were evaluated with a CT of your abdomen and labs.  Urine is consistent with possible urinary tract infection you were treated in the ED with Rocephin IV. Please continue clear liquids and gradually increase your diet to regular diet Return to the emergency department if you are having new or worsening symptoms.

## 2023-07-10 NOTE — ED Notes (Signed)
Pt called 3x with no response for bed placement

## 2023-07-10 NOTE — ED Triage Notes (Signed)
Pt arrives via pov. PT reports lower abdominal pain for the past couple of days. Denies nausea, vomiting, or urinary symptoms. Pt reports she was here last night but had to leave.

## 2023-07-10 NOTE — ED Provider Notes (Signed)
[place] EMERGENCY DEPARTMENT AT [facility] Provider Note   CSN: [id number] Arrival date & time: 07/10/23  1006     History  Chief Complaint  Patient presents with   Abdominal Pain    DRIANA HUGHETT is a 37 y.o. female.  HPI 37 yo female co right sided abdominal pain for 2 days waxing and waning worse with po intake.  No nausea or vomiting, diarrhea, vaginal d/c, fever, or chills, pain worse with po intake and alcohol.  Patient is daily drinker.  LMP began last week and irregular.  No current bc,      Home Medications Prior to Admission medications   Medication Sig Start Date End Date Taking? Authorizing Provider  ibuprofen (ADVIL,MOTRIN) 600 MG tablet Take 1 tablet (600 mg total) by mouth every 6 (six) hours as needed. 09/21/14  Yes Waynard Reeds, MD  docusate sodium (COLACE) 100 MG capsule Take 1 capsule (100 mg total) by mouth 2 (two) times daily. Patient not taking: Reported on 07/10/2023 09/21/14   Waynard Reeds, MD  HYDROcodone-acetaminophen (NORCO/VICODIN) 5-325 MG tablet Take 1 tablet by mouth every 6 (six) hours as needed for severe pain. Patient not taking: Reported on 07/10/2023 12/11/20   Wallis Bamberg, PA-C  naproxen (NAPROSYN) 500 MG tablet Take 1 tablet (500 mg total) by mouth 2 (two) times daily with a meal. Patient not taking: Reported on 07/10/2023 12/11/20   Wallis Bamberg, PA-C  oxyCODONE-acetaminophen (ROXICET) 5-325 MG per tablet Take 2 tablets by mouth every 4 (four) hours as needed. May take 1-2 tablets every 4-6 hours as needed for pain Patient not taking: Reported on 07/10/2023 09/21/14   Waynard Reeds, MD  permethrin (ELIMITE) 5 % cream Apply to affected area once from the neck down and leave on overnight. Patient not taking: Reported on 07/10/2023 10/29/15   Charm Rings, MD  Prenatal Vit-Fe Fumarate-FA (PRENATAL MULTIVITAMIN) TABS tablet Take 1 tablet by mouth daily at 12 noon. Patient not taking: Reported on 07/10/2023    [provider]   risperiDONE (RISPERDAL) 2 MG tablet Take 1 tablet (2 mg total) by mouth 2 (two) times daily. Patient not taking: Reported on 07/10/2023 05/29/22   Toy Cookey E, NP  triamcinolone cream (KENALOG) 0.1 % Apply 1 application topically 2 (two) times daily. Patient not taking: Reported on 07/10/2023 10/29/15   Charm Rings, MD      Allergies    Patient has no known allergies.    Review of Systems   Review of Systems  Physical Exam Updated Vital Signs BP 121/81   Pulse 73   Temp 98 F (36.7 C)   Resp 19   Ht 1.549 m (5\' 1" )   Wt 52.2 kg   SpO2 100%   BMI 21.73 kg/m  Physical Exam Vitals and nursing note reviewed.  Constitutional:      General: She is not in acute distress.    Appearance: She is well-developed.  HENT:     Head: Normocephalic and atraumatic.     Right Ear: External ear normal.     Left Ear: External ear normal.     Nose: Nose normal.  Eyes:     Conjunctiva/sclera: Conjunctivae normal.     Pupils: Pupils are equal, round, and reactive to light.  Pulmonary:     Effort: Pulmonary effort is normal.     Breath sounds: Normal breath sounds.  Abdominal:     General: Abdomen is flat. Bowel sounds are decreased.  Palpations: Abdomen is soft.     Tenderness: There is abdominal tenderness in the right upper quadrant and right lower quadrant.  Musculoskeletal:        General: Normal range of motion.     Cervical back: Normal range of motion and neck supple.  Skin:    General: Skin is warm and dry.  Neurological:     Mental Status: She is alert and oriented to person, place, and time.     Motor: No abnormal muscle tone.     Coordination: Coordination normal.  Psychiatric:        Behavior: Behavior normal.        Thought Content: Thought content normal.     ED Results / Procedures / Treatments   Labs (all labs ordered are listed, but only abnormal results are displayed) Labs Reviewed  COMPREHENSIVE METABOLIC PANEL - Abnormal; Notable for the following  components:      Result Value   Sodium 133 (*)    Total Protein 8.3 (*)    All other components within normal limits  CBC - Abnormal; Notable for the following components:   WBC 16.9 (*)    RBC 5.24 (*)    Hemoglobin 16.2 (*)    HCT 48.5 (*)    All other components within normal limits  URINALYSIS, ROUTINE W REFLEX MICROSCOPIC - Abnormal; Notable for the following components:   APPearance HAZY (*)    Hgb urine dipstick SMALL (*)    Ketones, ur 20 (*)    Protein, ur 30 (*)    Leukocytes,Ua MODERATE (*)    Bacteria, UA FEW (*)    All other components within normal limits  LIPASE, BLOOD  HCG, SERUM, QUALITATIVE    EKG None  Radiology CT ABDOMEN PELVIS W CONTRAST  Result Date: 07/10/2023 CLINICAL DATA:  Lower abdominal pain for the past few days. EXAM: CT ABDOMEN AND PELVIS WITH CONTRAST TECHNIQUE: Multidetector CT imaging of the abdomen and pelvis was performed using the standard protocol following bolus administration of intravenous contrast. RADIATION DOSE REDUCTION: This exam was performed according to the departmental dose-optimization program which includes automated exposure control, adjustment of the mA and/or kV according to patient size and/or use of iterative reconstruction technique. CONTRAST:  75mL OMNIPAQUE IOHEXOL 350 MG/ML SOLN COMPARISON:  None Available. FINDINGS: Lower chest: Bilateral lower lobe subsegmental atelectasis. Hepatobiliary: Small focus of focal fat along the falciform ligament. No other focal liver abnormality. Normal gallbladder. No biliary dilatation. Pancreas: Unremarkable. No pancreatic ductal dilatation or surrounding inflammatory changes. Spleen: Normal in size without focal abnormality. Adrenals/Urinary Tract: Adrenal glands are unremarkable. Kidneys are normal, without renal calculi, focal lesion, or hydronephrosis. Bladder is unremarkable. Stomach/Bowel: Stomach is within normal limits. Scattered mild gaseous distention of colon. No obstruction. The  appendix is not visualized, but there are no signs of inflammation at the base of the cecum. Vascular/Lymphatic: No significant vascular findings are present. No enlarged abdominal or pelvic lymph nodes. Reproductive: Uterus and bilateral adnexa are unremarkable. Other: No abdominal wall hernia or abnormality. No abdominopelvic ascites. No pneumoperitoneum. Musculoskeletal: No acute or significant osseous findings. IMPRESSION: 1. No acute intra-abdominal process. Electronically Signed   By: Obie Dredge M.D.   On: 07/10/2023 12:50    Procedures Procedures    Medications Ordered in ED Medications  lactated ringers bolus 1,000 mL (0 mLs Intravenous Stopped 07/10/23 1209)  fentaNYL (SUBLIMAZE) injection 25 mcg (25 mcg Intravenous Given 07/10/23 1101)  iohexol (OMNIPAQUE) 350 MG/ML injection 75 mL (75 mLs Intravenous  Contrast Given 07/10/23 1225)  cefTRIAXone (ROCEPHIN) 2 g in sodium chloride 0.9 % 100 mL IVPB (0 g Intravenous Stopped 07/10/23 1359)    ED Course/ Medical Decision Making/ A&P                                 Medical Decision Making Amount and/or Complexity of Data Reviewed Labs: ordered. Radiology: ordered.  Risk Prescription drug management.   37 year old female with 2 days of right-sided abdominal pain.  She has had some associated nausea but no active vomiting.  Pain is worse with food.  She is tender to palpation on the right side of her abdomen both upper and lower. Differential diagnosis includes but is not limited to appendicitis, cholecystitis, small bowel obstruction, ovarian torsion, topic pregnancy Patient's pregnancy test is negative Urinalysis consistent with possible UTI with 21-50 white blood cells and bacteria present Will give Rocephin here in the ED and treat with Keflex. Patient feels improved.  She is tolerating p.o. without difficulty.  Discussed return precautions need for follow-up and she voices understanding.        Final Clinical Impression(s) /  ED Diagnoses Final diagnoses:  Generalized abdominal pain  Urinary tract infection with hematuria, site unspecified    Rx / DC Orders ED Discharge Orders     None         Margarita Grizzle, MD 07/10/23 1451

## 2023-11-09 ENCOUNTER — Other Ambulatory Visit: Payer: Self-pay

## 2023-11-09 ENCOUNTER — Encounter (HOSPITAL_COMMUNITY): Payer: Self-pay | Admitting: *Deleted

## 2023-11-09 ENCOUNTER — Emergency Department (HOSPITAL_COMMUNITY)
Admission: EM | Admit: 2023-11-09 | Discharge: 2023-11-10 | Disposition: A | Discharge status: Home / Self Care | Payer: MEDICAID | Attending: Emergency Medicine | Admitting: Emergency Medicine

## 2023-11-09 DIAGNOSIS — Z760 Encounter for issue of repeat prescription: Secondary | ICD-10-CM

## 2023-11-09 DIAGNOSIS — F319 Bipolar disorder, unspecified: Secondary | ICD-10-CM | POA: Diagnosis not present

## 2023-11-09 LAB — URINALYSIS, ROUTINE W REFLEX MICROSCOPIC
Bilirubin Urine: NEGATIVE
Glucose, UA: NEGATIVE mg/dL
Ketones, ur: NEGATIVE mg/dL
Nitrite: NEGATIVE
Protein, ur: NEGATIVE mg/dL
Specific Gravity, Urine: 1.001 — ABNORMAL LOW (ref 1.005–1.030)
pH: 7 (ref 5.0–8.0)

## 2023-11-09 LAB — BASIC METABOLIC PANEL
Anion gap: 11 (ref 5–15)
BUN: 6 mg/dL (ref 6–20)
CO2: 23 mmol/L (ref 22–32)
Calcium: 9.4 mg/dL (ref 8.9–10.3)
Chloride: 100 mmol/L (ref 98–111)
Creatinine, Ser: 0.55 mg/dL (ref 0.44–1.00)
GFR, Estimated: >60 mL/min (ref >60)
Glucose, Bld: 94 mg/dL (ref 70–99)
Potassium: 3.9 mmol/L (ref 3.5–5.1)
Sodium: 134 mmol/L — ABNORMAL LOW (ref 135–145)

## 2023-11-09 LAB — CBC WITH DIFFERENTIAL/PLATELET
Abs Immature Granulocytes: 0.01 10*3/uL (ref 0.00–0.07)
Basophils Absolute: 0 10*3/uL (ref 0.0–0.1)
Basophils Relative: 0 %
Eosinophils Absolute: 0 10*3/uL (ref 0.0–0.5)
Eosinophils Relative: 0 %
HCT: 44.2 % (ref 36.0–46.0)
Hemoglobin: 14.9 g/dL (ref 12.0–15.0)
Immature Granulocytes: 0 %
Lymphocytes Relative: 24 %
Lymphs Abs: 2.1 10*3/uL (ref 0.7–4.0)
MCH: 30.2 pg (ref 26.0–34.0)
MCHC: 33.7 g/dL (ref 30.0–36.0)
MCV: 89.5 fL (ref 80.0–100.0)
Monocytes Absolute: 0.8 10*3/uL (ref 0.1–1.0)
Monocytes Relative: 9 %
Neutro Abs: 5.9 10*3/uL (ref 1.7–7.7)
Neutrophils Relative %: 67 %
Platelets: 321 10*3/uL (ref 150–400)
RBC: 4.94 MIL/uL (ref 3.87–5.11)
RDW: 13.2 % (ref 11.5–15.5)
WBC: 8.9 10*3/uL (ref 4.0–10.5)
nRBC: 0 % (ref 0.0–0.2)

## 2023-11-09 LAB — ETHANOL: Alcohol, Ethyl (B): <10 mg/dL (ref <10)

## 2023-11-09 LAB — HCG, SERUM, QUALITATIVE: Preg, Serum: NEGATIVE

## 2023-11-09 LAB — ACETAMINOPHEN LEVEL: Acetaminophen (Tylenol), Serum: <10 ug/mL — ABNORMAL LOW (ref 10–30)

## 2023-11-09 LAB — SALICYLATE LEVEL: Salicylate Lvl: <7 mg/dL — ABNORMAL LOW (ref 7.0–30.0)

## 2023-11-09 MED ORDER — MORPHINE SULFATE (PF) 4 MG/ML IV SOLN: 4.0000 mg | Freq: Once | INTRAVENOUS | Status: DC

## 2023-11-09 MED ORDER — ALPRAZOLAM 0.25 MG PO TABS
1.0000 mg | ORAL_TABLET | Freq: Once | ORAL | Status: AC
  Administered 2023-11-09: 1 mg via ORAL
  Filled 2023-11-09: qty 4

## 2023-11-09 MED ORDER — ONDANSETRON HCL 4 MG/2ML IJ SOLN: 4.0000 mg | Freq: Once | INTRAMUSCULAR | Status: DC

## 2023-11-09 NOTE — ED Notes (Signed)
Pt provided sandwich bag per RN

## 2023-11-09 NOTE — ED Notes (Signed)
Pt is refusing blood work unless her mom is in the room. PA made aware.

## 2023-11-09 NOTE — ED Provider Notes (Signed)
[place] EMERGENCY DEPARTMENT AT [facility] Provider Note   CSN: [id number] Arrival date & time: 11/09/23  1612     History  Chief Complaint  Patient presents with   wants meds    Melinda Baker is a 37 y.o. female past medical history significant for bipolar 1 disorder presents today for medication refill patient states that she has not had her risperidone for 6 months.  Patient denies SI, HI, AVH, or any other complaints at this time.  HPI     Home Medications Prior to Admission medications   Medication Sig Start Date End Date Taking? Authorizing Provider  docusate sodium (COLACE) 100 MG capsule Take 1 capsule (100 mg total) by mouth 2 (two) times daily. Patient not taking: Reported on 07/10/2023 09/21/14   Waynard Reeds, MD  HYDROcodone-acetaminophen (NORCO/VICODIN) 5-325 MG tablet Take 1 tablet by mouth every 6 (six) hours as needed for severe pain. Patient not taking: Reported on 07/10/2023 12/11/20   Wallis Bamberg, PA-C  ibuprofen (ADVIL,MOTRIN) 600 MG tablet Take 1 tablet (600 mg total) by mouth every 6 (six) hours as needed. 09/21/14   Waynard Reeds, MD  naproxen (NAPROSYN) 500 MG tablet Take 1 tablet (500 mg total) by mouth 2 (two) times daily with a meal. Patient not taking: Reported on 07/10/2023 12/11/20   Wallis Bamberg, PA-C  oxyCODONE-acetaminophen (ROXICET) 5-325 MG per tablet Take 2 tablets by mouth every 4 (four) hours as needed. May take 1-2 tablets every 4-6 hours as needed for pain Patient not taking: Reported on 07/10/2023 09/21/14   Waynard Reeds, MD  permethrin (ELIMITE) 5 % cream Apply to affected area once from the neck down and leave on overnight. Patient not taking: Reported on 07/10/2023 10/29/15   Charm Rings, MD  Prenatal Vit-Fe Fumarate-FA (PRENATAL MULTIVITAMIN) TABS tablet Take 1 tablet by mouth daily at 12 noon. Patient not taking: Reported on 07/10/2023    [provider]  risperiDONE (RISPERDAL) 2 MG tablet Take 1 tablet (2 mg total) by  mouth 2 (two) times daily. Patient not taking: Reported on 07/10/2023 05/29/22   Toy Cookey E, NP  triamcinolone cream (KENALOG) 0.1 % Apply 1 application topically 2 (two) times daily. Patient not taking: Reported on 07/10/2023 10/29/15   Charm Rings, MD      Allergies    Patient has no known allergies.    Review of Systems   Review of Systems  Physical Exam Updated Vital Signs BP (!) 151/96 (BP Location: Right Arm)   Pulse 91   Temp 97.7 F (36.5 C)   Resp 20   Ht 5\' 1"  (1.549 m)   Wt 52.2 kg   LMP 11/09/2023   SpO2 100%   BMI 21.74 kg/m  Physical Exam  ED Results / Procedures / Treatments   Labs (all labs ordered are listed, but only abnormal results are displayed) Labs Reviewed  ACETAMINOPHEN LEVEL - Abnormal; Notable for the following components:      Result Value   Acetaminophen (Tylenol), Serum <10 (*)    All other components within normal limits  BASIC METABOLIC PANEL - Abnormal; Notable for the following components:   Sodium 134 (*)    All other components within normal limits  SALICYLATE LEVEL - Abnormal; Notable for the following components:   Salicylate Lvl <7.0 (*)    All other components within normal limits  URINALYSIS, ROUTINE W REFLEX MICROSCOPIC - Abnormal; Notable for the following components:   Color, Urine STRAW (*)  Specific Gravity, Urine 1.001 (*)    Hgb urine dipstick LARGE (*)    Leukocytes,Ua TRACE (*)    Bacteria, UA RARE (*)    All other components within normal limits  ETHANOL  HCG, SERUM, QUALITATIVE  CBC WITH DIFFERENTIAL/PLATELET    EKG None  Radiology No results found.  Procedures Procedures    Medications Ordered in ED Medications  ALPRAZolam Prudy Feeler) tablet 1 mg (1 mg Oral Given 11/09/23 1945)    ED Course/ Medical Decision Making/ A&P                                 Medical Decision Making Amount and/or Complexity of Data Reviewed Labs: ordered.  Risk Prescription drug management.   This patient  presents to the ED with chief complaint(s) of medication refill with pertinent past medical history of bipolar 1 disorder which further complicates the presenting complaint. The complaint involves an extensive differential diagnosis and also carries with it a high risk of complications and morbidity.    The differential diagnosis includes bipolar 1 disorder  Additional history obtained: Additional history obtained from family  ED Course and Reassessment: Patient given 1 mg of Xanax for anxiety.  Independent labs interpretation:  The following labs were independently interpreted:  CBC: Notable findings BMP: No notable findings Acetaminophen level: Less than 10 Ethanol: Less than 10 Salicylate level: 7 7 hCG: Negative UA: Large hemoglobin, trace leuks, mildly decreased specific gravity, rare bacteria    Consultation: - Consulted or discussed management/test interpretation w/ external professional: Consulted TTS  Consideration for admission or further workup: Patient has been cleared medically.  Patient's dispo will be determined by TTS recommendation.        Final Clinical Impression(s) / ED Diagnoses Final diagnoses:  Medication refill    Rx / DC Orders ED Discharge Orders     None         Dolphus Jenny, PA-C 11/09/23 2018    Melene Plan, DO 11/09/23 2038

## 2023-11-09 NOTE — BH Assessment (Signed)
Clinician spoke to [facility] to complete TTS assessment. Clinician provided pt's name, MRN, location, age, and room number. Secure message completed.   [facility] coordinator to sent secure message when pt is ready to be assessed.   Redmond Pulling, MS, [facility], [facility] Triage Specialist [phone]

## 2023-11-09 NOTE — ED Triage Notes (Signed)
The pt has not had her behavorial meds for 6 months and she just left [facility] ??  Lmp now

## 2023-11-10 DIAGNOSIS — F319 Bipolar disorder, unspecified: Secondary | ICD-10-CM

## 2023-11-10 DIAGNOSIS — Z760 Encounter for issue of repeat prescription: Secondary | ICD-10-CM

## 2023-11-10 MED ORDER — RISPERIDONE 2 MG PO TABS: 2.0000 mg | ORAL_TABLET | Freq: Every day | ORAL | 0 refills | Status: DC

## 2023-11-10 MED ORDER — RISPERIDONE 1 MG PO TABS
2.0000 mg | ORAL_TABLET | Freq: Every day | ORAL | Status: DC
  Administered 2023-11-10: 2 mg via ORAL
  Filled 2023-11-10 (×2): qty 2

## 2023-11-10 NOTE — Consult Note (Signed)
[facility] Consult Note  Patient Name: Melinda Baker MRN: [id number] DOB: 10/03/1986 DATE OF Consult: 11/10/2023  PRIMARY PSYCHIATRIC DIAGNOSES  1.  Bipolar disorder  RECOMMENDATIONS  Medication recommendations: Start Risperdal 2mg  PO QHS for mood stabilization. Non-Medication/therapeutic recommendations: Initiate referral to outpatient psychiatric provider for medication management and therapy   Patient is requesting that her mother be contacted in the morning to come pick her up.  Communication: Treatment team members (and family members if applicable) who were involved in treatment/care discussions and planning, and with whom we spoke or engaged with via secure text/chat, include the following:  patient's treatment team.  Thank you for involving [place] in the care of this patient. If you have any additional questions or concerns, please call [phone] and ask for me or the provider on-call.  TELEPSYCHIATRY ATTESTATION & CONSENT  As the provider for this telehealth consult, I attest that I verified the patient's identity using two separate identifiers, introduced myself to the patient, provided my credentials, disclosed my location, and performed this encounter via a HIPAA-compliant, real-time, face-to-face, two-way, interactive audio and video platform and with the full consent and agreement of the patient (or guardian as applicable.)  Patient physical location: [facility]. Telehealth provider physical location: home office in state of [place].  Video start time: 0203 (Central Time) Video end time: 0215 (Central Time)  IDENTIFYING DATA  Melinda Baker is a 37 y.o. year-old female for whom a psychiatric consultation has been ordered by the primary provider. The patient was identified using two separate identifiers.  CHIEF COMPLAINT/REASON FOR CONSULT  - Hasn't taken medication in 6 months - Wants to get back on her medication - Feels better since she has been in the ER  but wants to improve further  HISTORY OF PRESENT ILLNESS (HPI)  The patient, a 37 year old woman with a history of bipolar 1 disorder. She reported not taking her medication for the past six months. 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 Despite this, she acknowledged feeling better since being in the ER and is motivated to resume her medication to improve further and regain her mother's trust. She mentioned staying in a hotel or with friends, while her 72-year-old daughter lives with her mother. The patient expressed a strong desire to reconnect with her daughter and improve her relationship with her mother.  She reported not sleeping well for the past 2-3 days but managed to get some rest while in the ER. She reports that her appetite has increased since being in the hospital. She denied experiencing hallucinations, paranoia, or thoughts of self-harm. The patient mentioned smoking cigarettes, though she has abstained for a day, and using marijuana, which she considered for medical reasons but now plans to stop. She reports that she was previously taking 2 mg of Risperdal at night to avoid daytime drowsiness, which interfered with her job.  There was no recent psychiatric hospitalization, and she denied any suicide attempts. She previously attended [facility] in [place] for psychiatric care but stopped after changes in her care provider. The patient expressed a desire to maintain her job, adhere to her medication regimen, and attend appointments to support her recovery and family relationships. Patient gave consent to this provider to contact her mother Bishop Dublin [phone] ) but this provider was unable to reach her.  PAST PSYCHIATRIC HISTORY  - Attended [facility] in [place] for psychiatric care - No recent psychiatric hospitalization -  No history of suicide attempts  PAST MEDICAL HISTORY  Past Medical  History:  Diagnosis Date   Allergy    Asthma    Bipolar 1 disorder [facility])      HOME MEDICATIONS  Facility Ordered Medications  Medication   [COMPLETED] ALPRAZolam (XANAX) tablet 1 mg   risperiDONE (RISPERDAL) tablet 2 mg   PTA Medications  Medication Sig   Prenatal Vit-Fe Fumarate-FA (PRENATAL MULTIVITAMIN) TABS tablet Take 1 tablet by mouth daily at 12 noon. (Patient not taking: Reported on 07/10/2023)   ibuprofen (ADVIL,MOTRIN) 600 MG tablet Take 1 tablet (600 mg total) by mouth every 6 (six) hours as needed.   docusate sodium (COLACE) 100 MG capsule Take 1 capsule (100 mg total) by mouth 2 (two) times daily. (Patient not taking: Reported on 07/10/2023)   oxyCODONE-acetaminophen (ROXICET) 5-325 MG per tablet Take 2 tablets by mouth every 4 (four) hours as needed. May take 1-2 tablets every 4-6 hours as needed for pain (Patient not taking: Reported on 07/10/2023)   permethrin (ELIMITE) 5 % cream Apply to affected area once from the neck down and leave on overnight. (Patient not taking: Reported on 07/10/2023)   triamcinolone cream (KENALOG) 0.1 % Apply 1 application topically 2 (two) times daily. (Patient not taking: Reported on 07/10/2023)   naproxen (NAPROSYN) 500 MG tablet Take 1 tablet (500 mg total) by mouth 2 (two) times daily with a meal. (Patient not taking: Reported on 07/10/2023)   HYDROcodone-acetaminophen (NORCO/VICODIN) 5-325 MG tablet Take 1 tablet by mouth every 6 (six) hours as needed for severe pain. (Patient not taking: Reported on 07/10/2023)   risperiDONE (RISPERDAL) 2 MG tablet Take 1 tablet (2 mg total) by mouth 2 (two) times daily. (Patient not taking: Reported on 07/10/2023)     ALLERGIES  No Known Allergies  SOCIAL & SUBSTANCE USE HISTORY  Social History   Socioeconomic History   Marital status: Single    Spouse name: Not on file   Number of children: Not on file   Years of education: Not on file   Highest education level: Not on file  Occupational History    Comment:  server, [employer]  Tobacco Use   Smoking status: Every Day    Current packs/day: 0.50    Average packs/day: 0.5 packs/day for 5.0 years (2.5 ttl pk-yrs)    Types: Cigarettes   Smokeless tobacco: Never   Tobacco comments:    e-cig, trying to "do better"  Substance and Sexual Activity   Alcohol use: Yes    Comment: once a week.   Drug use: No    Comment: hx of ectasy, marijuana use    Sexual activity: Yes    Partners: Male    Birth control/protection: Condom  Other Topics Concern   Not on file  Social History Narrative   Lives with roommate.  [name].   Social Determinants of Health   Financial Resource Strain: Not on file  Food Insecurity: Not on file  Transportation Needs: Not on file  Physical Activity: Not on file  Stress: Not on file  Social Connections: Not on file   Social History   Tobacco Use  Smoking Status Every Day   Current packs/day: 0.50   Average packs/day: 0.5 packs/day for 5.0 years (2.5 ttl pk-yrs)   Types: Cigarettes  Smokeless Tobacco Never  Tobacco Comments   e-cig, trying to "do better"   Social History   Substance and Sexual Activity  Alcohol Use Yes   Comment: once a week.  Social History   Substance and Sexual Activity  Drug Use No   Comment: hx of ectasy, marijuana use     Additional pertinent information .  FAMILY HISTORY  Family History  Problem Relation Age of Onset   Diabetes Mother    Stroke Mother        TIA   Diabetes Father    Cancer Paternal Aunt    Diabetes Maternal Grandmother    Family Psychiatric History (if known):  reports her brother has anxiety  MENTAL STATUS EXAM (MSE)  Presentation  General Appearance: Appropriate for Environment Eye Contact:Fair Speech:Clear and Coherent Speech Volume:Normal Handedness:Right  Mood and Affect  Mood:Depressed Affect:Congruent  Thought Process  Thought Processes:Coherent Descriptions of Associations:Circumstantial  Orientation:Full (Time, Place and  Person)  Thought Content:Logical  History of Schizophrenia/Schizoaffective disorder:No data recorded Duration of Psychotic Symptoms:No data recorded Hallucinations:Hallucinations: -- (denies)  Ideas of Reference:-- (denies)  Suicidal Thoughts:Suicidal Thoughts: No  Homicidal Thoughts:Homicidal Thoughts: No   Sensorium  Memory:Immediate Fair; Recent Fair; Remote Fair Judgment:Fair Insight:Fair  Executive Functions  Concentration:Fair Attention Span:Fair Recall:Fair Fund of Knowledge:Fair Language:Good  Psychomotor Activity  Psychomotor Activity:Psychomotor Activity: Normal  Assets  Assets:Communication Skills; Desire for Improvement  Sleep  Sleep:No data recorded  VITALS  Blood pressure (!) 151/96, pulse 91, temperature 97.7 F (36.5 C), resp. rate 20, height 5\' 1"  (1.549 m), weight 52.2 kg, last menstrual period 11/09/2023, SpO2 100%.  LABS  Admission on 11/09/2023  Component Date Value Ref Range Status   Acetaminophen (Tylenol), Serum 11/09/2023 <10 (L)  10 - 30 ug/mL Final   Comment: (NOTE) Therapeutic concentrations vary significantly. A range of 10-30 ug/mL  may be an effective concentration for many patients. However, some  are best treated at concentrations outside of this range. Acetaminophen concentrations >150 ug/mL at 4 hours after ingestion  and >50 ug/mL at 12 hours after ingestion are often associated with  toxic reactions.  Performed at [facility], [address] [place], [place] [address]    Sodium 11/09/2023 134 (L)  135 - 145 mmol/L Final   Potassium 11/09/2023 3.9  3.5 - 5.1 mmol/L Final   Chloride 11/09/2023 100  98 - 111 mmol/L Final   CO2 11/09/2023 23  22 - 32 mmol/L Final   Glucose, Bld 11/09/2023 94  70 - 99 mg/dL Final   Glucose reference range applies only to samples taken after fasting for at least 8 hours.   BUN 11/09/2023 6  6 - 20 mg/dL Final   Creatinine, Ser 11/09/2023 0.55  0.44 - 1.00 mg/dL Final   Calcium  69/62/9528 9.4  8.9 - 10.3 mg/dL Final   GFR, Estimated 11/09/2023 >60  >60 mL/min Final   Comment: (NOTE) Calculated using the CKD-EPI Creatinine Equation (2021)    Anion gap 11/09/2023 11  5 - 15 Final   Performed at [facility], [address], [place], [place] [address]   Alcohol, Ethyl (B) 11/09/2023 <10  <10 mg/dL Final   Comment: (NOTE) Lowest detectable limit for serum alcohol is 10 mg/dL.  For medical purposes only. Performed at [facility], [address], [place], [place] [address]    Preg, Serum 11/09/2023 NEGATIVE  NEGATIVE Final   Comment:        THE SENSITIVITY OF THIS METHODOLOGY IS >10 mIU/mL. Performed at [facility], [address] [place], [place] [address]    Salicylate Lvl 11/09/2023 <7.0 (L)  7.0 - 30.0 mg/dL Final   Performed  at [facility], [address], [place], [place] [address]   WBC 11/09/2023 8.9  4.0 - 10.5 K/uL Final   RBC 11/09/2023 4.94  3.87 - 5.11 MIL/uL Final   Hemoglobin 11/09/2023 14.9  12.0 - 15.0 g/dL Final   HCT 25/36/6440 44.2  36.0 - 46.0 % Final   MCV 11/09/2023 89.5  80.0 - 100.0 fL Final   MCH 11/09/2023 30.2  26.0 - 34.0 pg Final   MCHC 11/09/2023 33.7  30.0 - 36.0 g/dL Final   RDW 34/74/2595 13.2  11.5 - 15.5 % Final   Platelets 11/09/2023 321  150 - 400 K/uL Final   nRBC 11/09/2023 0.0  0.0 - 0.2 % Final   Neutrophils Relative % 11/09/2023 67  % Final   Neutro Abs 11/09/2023 5.9  1.7 - 7.7 K/uL Final   Lymphocytes Relative 11/09/2023 24  % Final   Lymphs Abs 11/09/2023 2.1  0.7 - 4.0 K/uL Final   Monocytes Relative 11/09/2023 9  % Final   Monocytes Absolute 11/09/2023 0.8  0.1 - 1.0 K/uL Final   Eosinophils Relative 11/09/2023 0  % Final   Eosinophils Absolute 11/09/2023 0.0  0.0 - 0.5 K/uL Final   Basophils Relative 11/09/2023 0  % Final   Basophils Absolute 11/09/2023 0.0  0.0 - 0.1 K/uL Final   Immature Granulocytes 11/09/2023 0  % Final   Abs Immature Granulocytes 11/09/2023 0.01  0.00 -  0.07 K/uL Final   Performed at [facility], [address], [place], [place] [address]   Color, Urine 11/09/2023 STRAW (A)  YELLOW Final   APPearance 11/09/2023 CLEAR  CLEAR Final   Specific Gravity, Urine 11/09/2023 1.001 (L)  1.005 - 1.030 Final   pH 11/09/2023 7.0  5.0 - 8.0 Final   Glucose, UA 11/09/2023 NEGATIVE  NEGATIVE mg/dL Final   Hgb urine dipstick 11/09/2023 LARGE (A)  NEGATIVE Final   Bilirubin Urine 11/09/2023 NEGATIVE  NEGATIVE Final   Ketones, ur 11/09/2023 NEGATIVE  NEGATIVE mg/dL Final   Protein, ur 64/33/2951 NEGATIVE  NEGATIVE mg/dL Final   Nitrite 88/41/6606 NEGATIVE  NEGATIVE Final   Leukocytes,Ua 11/09/2023 TRACE (A)  NEGATIVE Final   RBC / HPF 11/09/2023 0-5  0 - 5 RBC/hpf Final   WBC, UA 11/09/2023 0-5  0 - 5 WBC/hpf Final   Bacteria, UA 11/09/2023 RARE (A)  NONE SEEN Final   Squamous Epithelial / HPF 11/09/2023 0-5  0 - 5 /HPF Final   Performed at [facility], [address], [place], [place] [address]    PSYCHIATRIC REVIEW OF SYSTEMS (ROS)  Patient reports feeling depressed but denies suicidal and homicidal ideations. She denies having auditory and visual hallucinations. The patient reported difficulty sleeping over the past few days  Additional findings:      Musculoskeletal: No abnormal movements observed      Gait & Station: Laying/Sitting      Pain Screening: Denies      Nutrition & Dental Concerns: denies current concern  RISK FORMULATION/ASSESSMENT  Is the patient experiencing any suicidal or homicidal ideations: No       Explain if yes:  Protective factors considered for safety management: access to appropriate clinical intervention, supportive mother  Risk factors/concerns considered for safety management:  Depression Substance abuse/dependence Unmarried  Is there a safety management plan with the patient and treatment team to minimize risk factors and promote protective factors: Yes           Explain: outpatient  psych services,  medication management Is crisis care placement or psychiatric hospitalization recommended: No     Based on my current evaluation and risk assessment, patient is determined at this time to be at:  Moderate Risk  *RISK ASSESSMENT Risk assessment is a dynamic process; it is possible that this patient's condition, and risk level, may change. This should be re-evaluated and managed over time as appropriate. Please re-consult psychiatric consult services if additional assistance is needed in terms of risk assessment and management. If your team decides to discharge this patient, please advise the patient how to best access emergency psychiatric services, or to call 911, if their condition worsens or they feel unsafe in any way.  The patient has not taken her prescribed medication for six months. She expressed that she felt better without it but acknowledged the need to resume it to improve her family relationships. She denies suicidal and homicidal ideations.   Norval Morton, NP Telepsychiatry Consult Services

## 2023-11-10 NOTE — Discharge Instructions (Signed)
You can take Risperdal, 2 mg each day at bedtime.

## 2023-12-30 ENCOUNTER — Other Ambulatory Visit (HOSPITAL_COMMUNITY)
Admission: EM | Admit: 2023-12-30 | Discharge: 2024-01-01 | Payer: MEDICAID | Attending: Psychiatry | Admitting: Psychiatry

## 2023-12-30 DIAGNOSIS — G4700 Insomnia, unspecified: Secondary | ICD-10-CM | POA: Insufficient documentation

## 2023-12-30 DIAGNOSIS — F17210 Nicotine dependence, cigarettes, uncomplicated: Secondary | ICD-10-CM | POA: Insufficient documentation

## 2023-12-30 DIAGNOSIS — F1010 Alcohol abuse, uncomplicated: Secondary | ICD-10-CM | POA: Diagnosis not present

## 2023-12-30 DIAGNOSIS — F310 Bipolar disorder, current episode hypomanic: Secondary | ICD-10-CM

## 2023-12-30 DIAGNOSIS — F1090 Alcohol use, unspecified, uncomplicated: Secondary | ICD-10-CM

## 2023-12-30 DIAGNOSIS — F3112 Bipolar disorder, current episode manic without psychotic features, moderate: Secondary | ICD-10-CM | POA: Insufficient documentation

## 2023-12-30 LAB — COMPREHENSIVE METABOLIC PANEL
ALT: 14 U/L (ref 0–44)
AST: 16 U/L (ref 15–41)
Albumin: 4.4 g/dL (ref 3.5–5.0)
Alkaline Phosphatase: 77 U/L (ref 38–126)
Anion gap: 9 (ref 5–15)
BUN: <5 mg/dL — ABNORMAL LOW (ref 6–20)
CO2: 28 mmol/L (ref 22–32)
Calcium: 10.1 mg/dL (ref 8.9–10.3)
Chloride: 99 mmol/L (ref 98–111)
Creatinine, Ser: 0.63 mg/dL (ref 0.44–1.00)
GFR, Estimated: >60 mL/min (ref >60)
Glucose, Bld: 96 mg/dL (ref 70–99)
Potassium: 4 mmol/L (ref 3.5–5.1)
Sodium: 136 mmol/L (ref 135–145)
Total Bilirubin: 0.9 mg/dL (ref 0.0–1.2)
Total Protein: 8.2 g/dL — ABNORMAL HIGH (ref 6.5–8.1)

## 2023-12-30 LAB — CBC WITH DIFFERENTIAL/PLATELET
Abs Immature Granulocytes: 0.03 10*3/uL (ref 0.00–0.07)
Basophils Absolute: 0.1 10*3/uL (ref 0.0–0.1)
Basophils Relative: 1 %
Eosinophils Absolute: 0 10*3/uL (ref 0.0–0.5)
Eosinophils Relative: 0 %
HCT: 46.5 % — ABNORMAL HIGH (ref 36.0–46.0)
Hemoglobin: 15.6 g/dL — ABNORMAL HIGH (ref 12.0–15.0)
Immature Granulocytes: 0 %
Lymphocytes Relative: 22 %
Lymphs Abs: 1.8 10*3/uL (ref 0.7–4.0)
MCH: 30.4 pg (ref 26.0–34.0)
MCHC: 33.5 g/dL (ref 30.0–36.0)
MCV: 90.6 fL (ref 80.0–100.0)
Monocytes Absolute: 0.9 10*3/uL (ref 0.1–1.0)
Monocytes Relative: 11 %
Neutro Abs: 5.4 10*3/uL (ref 1.7–7.7)
Neutrophils Relative %: 66 %
Platelets: 394 10*3/uL (ref 150–400)
RBC: 5.13 MIL/uL — ABNORMAL HIGH (ref 3.87–5.11)
RDW: 13.1 % (ref 11.5–15.5)
WBC: 8.2 10*3/uL (ref 4.0–10.5)
nRBC: 0 % (ref 0.0–0.2)

## 2023-12-30 LAB — HEMOGLOBIN A1C
Hgb A1c MFr Bld: 5.2 % (ref 4.8–5.6)
Mean Plasma Glucose: 102.54 mg/dL

## 2023-12-30 LAB — POCT URINE DRUG SCREEN - MANUAL ENTRY (I-SCREEN)
POC Amphetamine UR: NOT DETECTED
POC Buprenorphine (BUP): NOT DETECTED
POC Cocaine UR: NOT DETECTED
POC Marijuana UR: NOT DETECTED
POC Methadone UR: NOT DETECTED
POC Methamphetamine UR: NOT DETECTED
POC Morphine: NOT DETECTED
POC Oxazepam (BZO): NOT DETECTED
POC Oxycodone UR: NOT DETECTED
POC Secobarbital (BAR): NOT DETECTED

## 2023-12-30 LAB — ETHANOL: Alcohol, Ethyl (B): <10 mg/dL (ref <10)

## 2023-12-30 LAB — SARS CORONAVIRUS 2 BY RT PCR: SARS Coronavirus 2 by RT PCR: NEGATIVE

## 2023-12-30 LAB — TSH: TSH: 1.586 u[IU]/mL (ref 0.350–4.500)

## 2023-12-30 LAB — POC URINE PREG, ED: Preg Test, Ur: NEGATIVE

## 2023-12-30 MED ORDER — LORAZEPAM 1 MG PO TABS
1.0000 mg | ORAL_TABLET | Freq: Four times a day (QID) | ORAL | Status: AC
  Administered 2023-12-30 – 2023-12-31 (×2): 1 mg via ORAL
  Filled 2023-12-30 (×2): qty 1

## 2023-12-30 MED ORDER — LORAZEPAM 1 MG PO TABS: 1.0000 mg | ORAL_TABLET | Freq: Every day | ORAL | Status: DC

## 2023-12-30 MED ORDER — HALOPERIDOL LACTATE 5 MG/ML IJ SOLN
5.0000 mg | Freq: Three times a day (TID) | INTRAMUSCULAR | Status: DC | PRN
Start: 2023-12-30 — End: 2024-01-01

## 2023-12-30 MED ORDER — THIAMINE HCL 100 MG/ML IJ SOLN
100.0000 mg | Freq: Once | INTRAMUSCULAR | Status: DC
  Filled 2023-12-30: qty 2

## 2023-12-30 MED ORDER — ACETAMINOPHEN 325 MG PO TABS: 650.0000 mg | ORAL_TABLET | Freq: Four times a day (QID) | ORAL | Status: DC | PRN

## 2023-12-30 MED ORDER — LOPERAMIDE HCL 2 MG PO CAPS: 2.0000 mg | ORAL_CAPSULE | ORAL | Status: DC | PRN

## 2023-12-30 MED ORDER — MAGNESIUM HYDROXIDE 400 MG/5ML PO SUSP: 30.0000 mL | Freq: Every day | ORAL | Status: DC | PRN

## 2023-12-30 MED ORDER — HALOPERIDOL 5 MG PO TABS: 5.0000 mg | ORAL_TABLET | Freq: Three times a day (TID) | ORAL | Status: DC | PRN

## 2023-12-30 MED ORDER — LORAZEPAM 1 MG PO TABS
1.0000 mg | ORAL_TABLET | Freq: Three times a day (TID) | ORAL | Status: DC
  Administered 2023-12-31 – 2024-01-01 (×2): 1 mg via ORAL
  Filled 2023-12-30 (×2): qty 1

## 2023-12-30 MED ORDER — HYDROXYZINE HCL 25 MG PO TABS: 25.0000 mg | ORAL_TABLET | Freq: Three times a day (TID) | ORAL | Status: DC | PRN

## 2023-12-30 MED ORDER — RISPERIDONE 2 MG PO TABS
2.0000 mg | ORAL_TABLET | Freq: Two times a day (BID) | ORAL | Status: DC
  Administered 2023-12-30 – 2024-01-01 (×4): 2 mg via ORAL
  Filled 2023-12-30 (×6): qty 1

## 2023-12-30 MED ORDER — LORAZEPAM 1 MG PO TABS
1.0000 mg | ORAL_TABLET | Freq: Once | ORAL | Status: DC
  Filled 2023-12-30 (×2): qty 1

## 2023-12-30 MED ORDER — ADULT MULTIVITAMIN W/MINERALS CH
1.0000 | ORAL_TABLET | Freq: Every day | ORAL | Status: DC
  Administered 2023-12-31 – 2024-01-01 (×2): 1 via ORAL
  Filled 2023-12-30 (×3): qty 1

## 2023-12-30 MED ORDER — RISPERIDONE 2 MG PO TABS: 2.0000 mg | ORAL_TABLET | Freq: Two times a day (BID) | ORAL | Status: DC

## 2023-12-30 MED ORDER — DIPHENHYDRAMINE HCL 50 MG/ML IJ SOLN
50.0000 mg | Freq: Three times a day (TID) | INTRAMUSCULAR | Status: DC | PRN
Start: 2023-12-30 — End: 2024-01-01

## 2023-12-30 MED ORDER — DIPHENHYDRAMINE HCL 50 MG PO CAPS: 50.0000 mg | ORAL_CAPSULE | Freq: Three times a day (TID) | ORAL | Status: DC | PRN

## 2023-12-30 MED ORDER — ALUM & MAG HYDROXIDE-SIMETH 200-200-20 MG/5ML PO SUSP: 30.0000 mL | ORAL | Status: DC | PRN

## 2023-12-30 MED ORDER — NICOTINE 21 MG/24HR TD PT24
21.0000 mg | MEDICATED_PATCH | Freq: Every day | TRANSDERMAL | Status: DC
  Filled 2023-12-30: qty 1

## 2023-12-30 MED ORDER — LORAZEPAM 1 MG PO TABS: 1.0000 mg | ORAL_TABLET | Freq: Two times a day (BID) | ORAL | Status: DC

## 2023-12-30 MED ORDER — THIAMINE MONONITRATE 100 MG PO TABS
100.0000 mg | ORAL_TABLET | Freq: Every day | ORAL | Status: DC
  Administered 2023-12-31 – 2024-01-01 (×2): 100 mg via ORAL
  Filled 2023-12-30 (×2): qty 1

## 2023-12-30 MED ORDER — LORAZEPAM 1 MG PO TABS: 1.0000 mg | ORAL_TABLET | Freq: Four times a day (QID) | ORAL | Status: DC | PRN

## 2023-12-30 MED ORDER — HALOPERIDOL LACTATE 5 MG/ML IJ SOLN: 10.0000 mg | Freq: Three times a day (TID) | INTRAMUSCULAR | Status: DC | PRN

## 2023-12-30 MED ORDER — HYDROXYZINE HCL 25 MG PO TABS: 25.0000 mg | ORAL_TABLET | Freq: Four times a day (QID) | ORAL | Status: DC | PRN

## 2023-12-30 MED ORDER — ONDANSETRON 4 MG PO TBDP: 4.0000 mg | ORAL_TABLET | Freq: Four times a day (QID) | ORAL | Status: DC | PRN

## 2023-12-30 MED ORDER — LORAZEPAM 2 MG/ML IJ SOLN
2.0000 mg | Freq: Three times a day (TID) | INTRAMUSCULAR | Status: DC | PRN
Start: 2023-12-30 — End: 2024-01-01

## 2023-12-30 NOTE — ED Notes (Signed)
Pt refused medication from this nurse x2. Pt explained the benefits of medication along with Donnal Debar RN, pt still refused. Medication wasted. Pt notified this nurse after medication was wasted that she wanted to take meds. This nurse informed her that she will have to take her meds at scheduled time on next shift. Will continue to monitor and report any COC.

## 2023-12-30 NOTE — ED Provider Notes (Signed)
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 demonstrate self-control will improve, Ability to identify and develop effective coping behaviors will improve, Ability to maintain clinical measurements within normal limits will improve, and Compliance with prescribed medications will improve             -- Long Term Goals: Improvement in symptoms so as ready for discharge                3. Medical Issues Being Addressed:              Labs ordered, not yet resulted/reviewed.               Tobacco Use Disorder             -- Nicotine patch 21mg /24 hours ordered             -- Smoking cessation encouraged   4. Discharge Planning:               --  Social work and case management to assist with discharge planning and identification of hospital follow-up needs prior to discharge             -- Estimated LOS: 5-7 days             -- Discharge Concerns: Need to establish a safety plan; Medication compliance and effectiveness             -- Discharge Goals: Return home with outpatient referrals for mental health follow-up including medication management/psychotherapy  Margaretmary Dys, MD 12/30/23  3:55 PM

## 2023-12-30 NOTE — ED Notes (Addendum)
Pt refused medication from this nurse. Will try again later
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
## 2023-12-30 NOTE — ED Notes (Signed)
Second attempt to administer medication and patient refused despite provided education. Pt Still paranoid and suspicious. No aggression. Safety maintained and Will continue to monitor behavior and report changes as noted.

## 2023-12-30 NOTE — ED Notes (Addendum)
Patient arrived via ambulatory and with her Mother. Reported  her medication is not working. Reported she has been drinking,and drank 6 beers last night. Patient appearance is disheveled. She is uncooperative only allowing partial assessment. Thoughts are guarded, paranoid and disorganized. She has refused all medications ordered. Stating "I only take them certain times". MD/NP made aware. Food was offered in which she became paranoid after taking a bite of her sandwich" I don't know what this is" "I don't know what ya'll given me!. She is loud in tone. No physical aggression observed. Attempted to contact to safety and she refused. Safety and Purposeful  rounding in effect. Will continue to monitor behavior for changes and report.

## 2023-12-30 NOTE — Discharge Instructions (Addendum)
Dear Melinda Baker,  It was a pleasure to take care of you during your stay at [facility]) where you were treated for your bipolar 1 disorder, current episode hypomanic.   While you were here, you were:  observed and cared for by our nurses and nursing assistants  treated with medications by your psychiatrists  provided resources by our social workers and case managers  Please review the medication list provided to you at discharge and stop, start taking, or continue taking the medications listed there.  You should also follow-up with your primary care doctor, or start seeing one if you don't have one yet. If applicable, here are some scheduled follow-ups for you:    I recommend abstinence from alcohol, tobacco, and other illicit drug use.   If your psychiatric symptoms or suicidal thoughts recur, worsen, or if you have side effects to your psychiatric medications, call your outpatient psychiatric provider, 911, [phone] or go to the nearest emergency department.  Take care!  Signed: Margaretmary Dys, MD 12/30/2023, 12:23 PM  Naloxone (Narcan) can help reverse an overdose when given to the victim quickly.  [place] offers free naloxone kits and instructions/training on its use.  Add naloxone to your first aid kit and you can help save a life. A prescription can be filled at your local pharmacy or free kits are provided by the county  Transfer to [facility]  Dear Melinda Baker,  Most effective treatment for your mental health disease involves BOTH a psychiatrist AND a therapist Psychiatrist to manage medications Therapist to help identify personal goals, barriers from those goals, and plan to achieve those goals by understanding emotions Please make regular appointments with an outpatient psychiatrist and other doctors once you leave the hospital (if any, otherwise, please see below for resources to make an appointment).  For therapy outside the  hospital, please ask for these specific types of therapy: DBT ________________________________________________________  SAFETY CRISIS  Dial 988 for National Suicide & Crisis Lifeline    Text (913)745-2914 for Crisis Text Line:     [facility] U[facility]ENT [facility]:  [address] FIRST FLOOR.  [place], [place] [address].  [phone]  Mobile Crisis Response Teams Listed by counties in vicinity of [facility] providers [facility] [phone] [facility] [phone] [facility] [phone] [facility] [phone] [place]                * [facility] [phone]                * [facility] [phone] [facility] [phone] [facility]  [phone] * [facility] [employer] [phone] ________________________________________________________  To see which pharmacy near you is the CHEAPEST for certain medications, please use GoodRx. It is free website and has a free phone app.    Also consider looking at [facility] $4.00 or [facility]'s $7.00 prescription list. Both are free to view if googled "[facility] $4 prescription" and "[facility] $7 prescription". These are set prices, no insurance required. [facility] low cost medications: $4-$15 for 30days prescriptions or $10-$38 for 90days prescriptions  ________________________________________________________  Difficulties with sleep?   Can also use this free app for insomnia called CBT-I. Let your doctors and therapists know so they can help with extra tips and tricks or for guidance and accountability. NO ADDS on the app.     ________________________________________________________  Non-Emergent / Urgent  [facility] [address] SECOND FLOOR [place],  [place] [address] [phone] OUTPATIENT Walk-in information: Please note, all walk-ins are first come & first serve, with limited number of availability.  Please note that to be eligible for services you must bring: ID or a piece of mail with your name [facility] address  Therapist for therapy:  Monday & Wednesdays: Please ARRIVE at 7:15 AM for registration Will START at 8:00 AM Every 1st & 2nd Friday of the month: Please ARRIVE at 10:15 AM for registration Will START at 1 PM - 5 PM  Psychiatrist for medication management: Monday - Friday:  Please ARRIVE at 7:15 AM for registration Will START at 8:00 AM  Regretfully, due to limited availability, please be aware that you may not been seen on the same day as walk-in. Please consider making an appoint or try again. Thank you for your patience and understanding.  Outpatient Therapy and Psychiatry Resources for Patients: Here are a series of links for finding a therapist.    Includes links to the following: [facility] (http://wilson-mayo.com/) (only for [facility] and please reserve for uninsured) [facility] (http://blankenship-martinez.net/) Psychology Today Special educational needs teacher (https://www.psychologytoday.com/us/therapists) Psychology Today Support Group Finder (https://www.psychologytoday.com/us/groups/) [facility] Location (https://carolinabehavioralcare.com/staff-location/[place]/) [facility] [place] - Support Group Finder - (RecordDebt.fi) [facility] [employer] - Lexicographer (https://fspcares.org/contact/) [employer] [facility] [place] - NAMI (https://namiguilford.org/support-and-education/support-groups/) Interior and spatial designer  Health - Affiliated with [facility] (https://www.[place].com/lb/locations/profile/[facility]-at-[address]/) Dept of Health and Human Services - Find a mental health facility (http://lester.info/)

## 2023-12-30 NOTE — ED Notes (Signed)
Pt refused blood draw and UDS

## 2023-12-30 NOTE — ED Notes (Signed)
Patient is currently on the unit in no acute distress with concerns of her Risperidone. Patient wants the pink one that she used to take as she feels that the ones are giving her is a different color and therefore a different dose. This nurse educated the patient that the dose we are giving her is also 2mg  as what she was taking prior to admission. However different manufacturers make pills of different colors.

## 2023-12-30 NOTE — BH Assessment (Signed)
Comprehensive Clinical Assessment (CCA) Note  12/30/2023 Melinda Baker [id number]  DISPOSITION: Per Dr. Weston Settle pt is recommended for Inpatient psychiatric admission  The patient demonstrates the following risk factors for suicide: Chronic risk factors for suicide include: psychiatric disorder of Bipolar 1 d/o . Acute risk factors for suicide include: family or marital conflict and social withdrawal/isolation. Protective factors for this patient include: positive social support and hope for the future. Considering these factors, the overall suicide risk at this point appears to be low. Patient is appropriate for outpatient follow up.   Per Triage Assessment: "Melinda Baker presents to [facility] voluntarily accompanied by her mother. Pt states that her medication is not working. Pt denies SI, HI, AVH and drug use at this time. Pt admits to drinking 6 beers last night. Pt states that she just wants to be happy." . Pt was not able to stay focused enough to answer some questions.   With further assessment: Pt is a 38 year old female who presented voluntarily accompanied by her mother due to worsening manic symptoms. Pt denied SI, HI, self-harm and VH but reported auditory hallucinations and displayed paranoia. Pt reported she hears voices telling her to protect herself from others. Pt reported that she believes that people can see her through walls which causes her anxiety. Pt reported decreased need for sleep and difficulty concentrating and focusing. Pt presented with disorganized thought processes (tangential) and pressured speech. Pt denied receiving services from OP psychiatric providers and stated that psychiatric medications she had been prescribed she was no longer taking as of about 6 months ago. Hx of Bipolar 1 d/o per chart. Pt has a hx of IP psychiatric admissions with the last admission in 2010. Pt was not able to stay focused enough to answer some questions. Pt stated that she sleeps about 4  hours per night and is able to eat normally.   Pt was anxious with a euphoric mood. Pt's affect was labile throughout. Pt was not able to stay focused enough to answer some questions. Pt was alert and seemed oriented although had trouble concentrating and being redirected. Pt displayed shallow insight and fair judgment. Pt had pressured speech and tangential thought processes.    Chief Complaint:  Chief Complaint  Patient presents with   Medication Problem   Visit Diagnosis:  Bipolar 1 d/o     CCA Screening, Triage and Referral (STR)  Patient Reported Information How did you hear about [place]? Family/Friend  What Is the Reason for Your Visit/Call Today? Melinda Baker presents to [facility] voluntarily accompanied by her mother. Pt states that her medication is not working. Pt denies SI, HI, AVH and drug use at this time. Pt admits to drinking 6 beers last night. Pt states that she just wants to be happy.  How Long Has This Been Causing You Problems? 1 wk - 1 month  What Do You Feel Would Help You the Most Today? Medication(s); Social Support   Have You Recently Had Any Thoughts About Hurting Yourself? No  Are You Planning to Commit Suicide/Harm Yourself At This time? No   Flowsheet Row ED from 12/30/2023 in [facility] ED from 11/09/2023 in [facility] Emergency Department at [facility] ED from 07/10/2023 in [facility] Emergency Department at [facility]  C-SSRS RISK CATEGORY No Risk No Risk No Risk       Have you Recently Had Thoughts About Hurting Someone Melinda Baker? No  Are You Planning to Harm Someone at  This Time? No  Explanation: na  Have You Used Any Alcohol or Drugs in the Past 24 Hours? Yes  How Long Ago Did You Use Drugs or Alcohol? Last night What Did You Use and How Much? 6 beers last night   Do You Currently Have a Therapist/Psychiatrist? No  Name of Therapist/Psychiatrist:    Have You Been Recently Discharged From Any  Office Practice or Programs? No  Explanation of Discharge From Practice/Program: na    CCA Screening Triage Referral Assessment Type of Contact: Face-to-Face  Telemedicine Service Delivery:   Is this Initial or Reassessment?   Date Telepsych consult ordered in [facility]:    Time Telepsych consult ordered in [facility]:    Location of Assessment: [facility] Assessment Services  Provider Location: [facility] Assessment Services   Collateral Involvement: Providser spoke with pt's mother with her permission.   Does Patient Have a Automotive engineer Guardian? No  Legal Guardian Contact Information: na  Copy of Legal Guardianship Form: No - copy requested  Legal Guardian Notified of Arrival: -- (family aware)  Legal Guardian Notified of Pending Discharge: -- (na)  If Minor and Not Living with Parent(s), Who has Custody? adult  Is CPS involved or ever been involved? -- (none reported)  Is APS involved or ever been involved? -- (none reported)   Patient Determined To Be At Risk for Harm To Self or Others Based on Review of Patient Reported Information or Presenting Complaint? No  Method: No Plan  Availability of Means: No access or NA  Intent: Vague intent or NA  Notification Required: -- (na)  Additional Information for Danger to Others Potential: Active psychosis (AH and delusional thinking)  Additional Comments for Danger to Others Potential: none  Are There Guns or Other Weapons in Your Home? No (denied)  Types of Guns/Weapons: na  Are These Weapons Safely Secured?                            -- (na)  Who Could Verify You Are Able To Have These Secured: na  Do You Have any Outstanding Charges, Pending Court Dates, Parole/Probation? denied  Contacted To Inform of Risk of Harm To Self or Others: -- (na)    Does Patient Present under Involuntary Commitment? No    [place] of Residence: [place]   Patient Currently Receiving the Following Services: Not Receiving  Services   Determination of Need: Emergent (2 hours) (Per Dr. Weston Settle pt is recommended for Inpatient psychiatric admission)   Options For Referral: Inpatient Hospitalization     CCA Biopsychosocial Patient Reported Schizophrenia/Schizoaffective Diagnosis in Past: No (per chart)   Strengths: able to accecpt help   Mental Health Symptoms Depression:  Change in energy/activity; Difficulty Concentrating; Sleep (too much or little)   Duration of Depressive symptoms: Duration of Depressive Symptoms: Greater than two weeks   Mania:  Change in energy/activity; Euphoria; Increased Energy; Racing thoughts   Anxiety:   Difficulty concentrating; Restlessness; Sleep; Worrying   Psychosis:  Hallucinations; Delusions   Duration of Psychotic symptoms: Duration of Psychotic Symptoms: Greater than six months   Trauma:  None   Obsessions:  None   Compulsions:  None   Inattention:  Disorganized; Does not seem to listen   Hyperactivity/Impulsivity:  None   Oppositional/Defiant Behaviors:  None   Emotional Irregularity:  None   Other Mood/Personality Symptoms:  none    Mental Status Exam Appearance and self-care  Stature:  Average  Weight:  Overweight   Clothing:  Casual   Grooming:  Normal   Cosmetic use:  Age appropriate   Posture/gait:  Normal   Motor activity:  Not Remarkable   Sensorium  Attention:  Distractible   Concentration:  Anxiety interferes; Focuses on irrelevancies; Scattered   Orientation:  X5   Recall/memory:  Normal   Affect and Mood  Affect:  Anxious; Full Range; Labile   Mood:  Anxious; Euphoric   Relating  Eye contact:  Normal   Facial expression:  Responsive   Attitude toward examiner:  Cooperative; Dramatic   Thought and Language  Speech flow: Pressured   Thought content:  Delusions   Preoccupation:  Other (Comment) (Safety)   Hallucinations:  Auditory (hearing "voices")   Organization:  Tangential; Disorganized    Company secretary of Knowledge:  Fair   Intelligence:  Average   Abstraction:  Functional   Judgement:  Impaired   Reality Testing:  Distorted   Insight:  Lacking   Decision Making:  Vacilates   Social Functioning  Social Maturity:  Impulsive   Social Judgement:  Heedless   Stress  Stressors:  Other (Comment) (Psychiatric symptoms)   Coping Ability:  Overwhelmed   Skill Deficits:  Interpersonal; Self-care; Self-control   Supports:  Family; Friends/Service system     Religion:    Leisure/Recreation: Leisure / Recreation Do You Have Hobbies?:  (Pt was not able to stay focused enough to answer some questions.)  Exercise/Diet: Exercise/Diet Do You Exercise?:  (Pt was not able to stay focused enough to answer some questions.) Have You Gained or Lost A Significant Amount of Weight in the Past Six Months?:  (Pt was not able to stay focused enough to answer some questions.) Do You Follow a Special Diet?:  (Pt was not able to stay focused enough to answer some questions.) Do You Have Any Trouble Sleeping?: Yes Explanation of Sleeping Difficulties: Pt has been experiencing a decreased need for sleep   CCA Employment/Education Employment/Work Situation: Employment / Work Situation Employment Situation:  (Pt was not able to stay focused enough to answer some questions.) Patient's Job has Been Impacted by Current Illness:  (na) Has Patient ever Been in the [employer]?: No  Education: Education Is Patient Currently Attending School?: No Last Grade Completed:  (Pt was not able to stay focused enough to answer some questions.) Did You Attend College?:  (Pt was not able to stay focused enough to answer some questions.) Did You Have An Individualized Education Program (IIEP):  (Pt was not able to stay focused enough to answer some questions.) Did You Have Any Difficulty At School?:  (Pt was not able to stay focused enough to answer some questions.) Patient's  Education Has Been Impacted by Current Illness:  (Pt was not able to stay focused enough to answer some questions.)   CCA Family/Childhood History Family and Relationship History: Family history Marital status: Single Does patient have children?: No  Childhood History:  Childhood History By whom was/is the patient raised?: Mother Did patient suffer any verbal/emotional/physical/sexual abuse as a child?: No Did patient suffer from severe childhood neglect?: No Has patient ever been sexually abused/assaulted/raped as an adolescent or adult?: No Was the patient ever a victim of a crime or a disaster?: No Witnessed domestic violence?: No Has patient been affected by domestic violence as an adult?: No       CCA Substance Use Alcohol/Drug Use: Alcohol / Drug Use Pain Medications: see MAR Prescriptions: see MAR Over the  Counter: see MAR History of alcohol / drug use?: Yes Longest period of sobriety (when/how long): unknown Negative Consequences of Use:  (none reported) Withdrawal Symptoms:  (none reported) Substance #1 Name of Substance 1: alcohol 1 - Age of First Use: teen 1 - Amount (size/oz): 6 beers 1 - Frequency: daily 1 - Duration: ongoing 1 - Last Use / Amount: last night 1 - Method of Aquiring: unknown 1- Route of Use: drink, oral                       ASAM's:  Six Dimensions of Multidimensional Assessment  Dimension 1:  Acute Intoxication and/or Withdrawal Potential:   Dimension 1:  Description of individual's past and current experiences of substance use and withdrawal: none reported  Dimension 2:  Biomedical Conditions and Complications:   Dimension 2:  Description of patient's biomedical conditions and  complications: none reported  Dimension 3:  Emotional, Behavioral, or Cognitive Conditions and Complications:  Dimension 3:  Description of emotional, behavioral, or cognitive conditions and complications: Hx of Bipolar 1 d/o  Dimension 4:  Readiness  to Change:     Dimension 5:  Relapse, Continued use, or Continued Problem Potential:     Dimension 6:  Recovery/Living Environment:     ASAM Severity Score: ASAM's Severity Rating Score: 8  ASAM Recommended Level of Treatment: ASAM Recommended Level of Treatment: Level II Intensive Outpatient Treatment   Substance use Disorder (SUD) Substance Use Disorder (SUD)  Checklist Symptoms of Substance Use: Continued use despite having a persistent/recurrent physical/psychological problem caused/exacerbated by use, Continued use despite persistent or recurrent social, interpersonal problems, caused or exacerbated by use, Recurrent use that results in a failure to fulfill major role obligations (work, school, home)  Recommendations for Services/Supports/Treatments: Recommendations for Services/Supports/Treatments Recommendations For Services/Supports/Treatments: CD-IOP Intensive Chemical Dependency Program  Disposition Recommendation per psychiatric provider: We recommend inpatient psychiatric hospitalization when medically cleared. Patient is under voluntary admission status at this time; please IVC if attempts to leave hospital.   DSM5 Diagnoses: Patient Active Problem List   Diagnosis Date Noted   Bipolar 1 disorder, manic, moderate (HCC) 12/30/2023   Medication refill 11/10/2023   Bipolar I disorder (HCC) 08/30/2020   Indication for care in labor or delivery 09/19/2014     Referrals to Alternative Service(s): Referred to Alternative Service(s):   Place:   Date:   Time:    Referred to Alternative Service(s):   Place:   Date:   Time:    Referred to Alternative Service(s):   Place:   Date:   Time:    Referred to Alternative Service(s):   Place:   Date:   Time:     Melinda Baker T, Counselor

## 2023-12-31 DIAGNOSIS — F1010 Alcohol abuse, uncomplicated: Secondary | ICD-10-CM | POA: Diagnosis not present

## 2023-12-31 DIAGNOSIS — G4700 Insomnia, unspecified: Secondary | ICD-10-CM | POA: Diagnosis not present

## 2023-12-31 DIAGNOSIS — F17210 Nicotine dependence, cigarettes, uncomplicated: Secondary | ICD-10-CM | POA: Diagnosis not present

## 2023-12-31 DIAGNOSIS — F3112 Bipolar disorder, current episode manic without psychotic features, moderate: Secondary | ICD-10-CM | POA: Diagnosis not present

## 2023-12-31 NOTE — ED Notes (Signed)
Pt resting in bed at the current c eyes closed. Resident denies HI, SI, & AVH. Denies pain or any discomfort at this time. No s/s of acute distress observed. VSS. Safety maintained. Will continue to monitor and report any COC.

## 2023-12-31 NOTE — ED Notes (Signed)
Patient observed/assessed in bed/chair resting quietly appearing in no distress and verbalizing no complaints at this time. Will continue to monitor.  

## 2023-12-31 NOTE — ED Provider Notes (Signed)
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        Thought Content: Thought content is not paranoid. Thought content does not include homicidal or  suicidal ideation.        Cognition and Memory: Memory normal.        Judgment: Judgment normal.     Comments: Slowed cognition slightly.     Review of Systems  Constitutional:  Negative for chills, fever and weight loss.  Respiratory:  Positive for cough.   Cardiovascular:  Negative for chest pain.  Gastrointestinal:  Negative for abdominal pain, diarrhea, nausea and vomiting.  Psychiatric/Behavioral:  Positive for depression. Negative for hallucinations, memory loss, substance abuse and suicidal ideas. The patient is nervous/anxious and has insomnia.    Blood pressure 106/71, pulse 97, temperature 98.7 F (37.1 C), temperature source Oral, resp. rate 16, SpO2 97%. There is no height or weight on file to calculate BMI.  Treatment Plan Summary: ASSESSMENT:  Patient will discharge tomorrow to [facility] for further stabilization and medication management.   Diagnoses / Active Problems: Bipolar 1 Disorder, current episode manic Alcohol use disorder, current   PLAN: Safety and Monitoring:             -- Involuntary admission to inpatient psychiatric unit for safety, stabilization and treatment             -- Daily contact with patient to assess and evaluate symptoms and progress in treatment             -- Patient's case to be discussed in multi-disciplinary team meeting             -- Observation Level : q15 minute checks             -- Vital signs:  q12 hours             -- Precautions: suicide, elopement, and assault   2. Psychiatric Diagnoses and Treatment:              -- Increase risperidone from 2 mg daily to 2 mg BID             -- CIWA protocols for alcohol detox  d-- The risks/benefits/side-effects/alternatives to this medication were discussed in detail with the patient and time was given for questions. The patient consents to medication trial.                -- Metabolic profile and EKG monitoring obtained while on an atypical antipsychotic (Estimated  body mass index is 21.74 kg/m as calculated from the following:   Height as of 11/09/23: 5\' 1"  (1.549 m).   Weight as of 11/09/23: 115 lb 1.3 oz (52.2 kg). Lipid Panel: (reordered for tomorrow prior to  discharge) HbgA1c: 5.2 QTc:) Ordered, not resulted             -- Encouraged patient to participate in unit milieu and in scheduled group therapies              -- Short Term Goals: Ability to identify changes in lifestyle to reduce recurrence of condition will improve, Ability to verbalize feelings will improve, Ability to demonstrate self-control will improve, Ability to identify and develop effective coping behaviors will improve, Ability to maintain clinical measurements within normal limits will improve, and Compliance with prescribed medications will improve             -- Long Term Goals: Improvement in symptoms so as ready for discharge                3. Medical Issues Being Addressed:              Labs ordered, not yet resulted/reviewed.                Tobacco Use Disorder             -- Nicotine patch 21mg /24 hours ordered             -- Smoking cessation encouraged   4. Discharge Planning:              -- Social work and case management to assist with discharge planning and identification of hospital follow-up needs prior to discharge             -- Estimated LOS: 5-7 days             -- Discharge Concerns: Need to establish a safety plan; Medication compliance and effectiveness             -- Discharge Goals: Return home with outpatient referrals for mental health follow-up including medication management/psychotherapy    Margaretmary Dys, MD 12/31/2023 11:44 AM

## 2023-12-31 NOTE — Progress Notes (Signed)
Pt has been accepted to [facility] on 01/01/2024 Bed assignment: [facility]/ TOMORROW   Pt meets inpatient criteria per: Tommy Rainwater MD   Attending Physician will be: Eilleen Kempf MD   Report can be called to: [phone]  Pt can arrive after 8 AM tomorrow   Care Team Notified: Tommy Rainwater MD, Earl Gala LPN  [place] Martha Soltys LCSW-A   12/31/2023 10:08 AM

## 2023-12-31 NOTE — ED Notes (Signed)
[employer] dept for pt transport to [facility]

## 2023-12-31 NOTE — ED Notes (Signed)
Pt sleeping@this time breathing even and unlabored will continue to monitor for safety 

## 2023-12-31 NOTE — ED Notes (Signed)
Pt will sit in chair by recliner she is assigned to she will fall asleep and wake up, then come to desk asking what time she can make a phone call because  she need to call her mom so she can leave it has been explained to her numerous of times that she is IVC'd

## 2023-12-31 NOTE — Progress Notes (Signed)
Inpatient Behavioral Health Placement   Pt meets inpatient behavioral health placement per Margaretmary Dys, MD. This CSW requested [facility] Herold Harms to review. 1st CSW to follow up.   Maryjean Ka, MSW, LCSWA 12/31/2023 1:10 AM

## 2023-12-31 NOTE — Progress Notes (Signed)
LCSW Progress Note  [id number]   Melinda Baker  12/31/2023  9:14 AM  Description:   Inpatient Psychiatric Referral  Patient was recommended inpatient per Verda Cumins NP). There are no available beds at [facility], per [facility] Rona Ravens RN.  Patient was referred to the following out of network facilities:   Destination  Service Provider Address Phone Fax  [facility] Health Patient Placement [facility], [place] [phone]  [facility] [address] [place] [address] [phone]  [facility] [address], [place] [address] [phone]  [facility] [address] [place] [address] [phone]  [facility] [address] [place], [place] [address] [phone]  [facility] [address] [place] [address] [phone]  [facility] [address], [place] [address] [phone]  [facility] [address] [place] [address] [phone]  [facility] [address], [place] [address] [phone]  [facility] [address], [place] [address] [phone]  [facility] [address] [place] [address] [phone]  [facility] [address] [place] [address] [phone]  [facility] EFAX [address] [place] [phone]  [facility] [address], [place] [address] [phone]  [facility] [address] [place] [address] [phone]      Situation ongoing, CSW to continue following and  update chart as more information becomes available.      [place] Deyton Ellenbecker,MSW, LCSW  12/31/2023 9:14 AM

## 2024-01-01 DIAGNOSIS — G4700 Insomnia, unspecified: Secondary | ICD-10-CM | POA: Diagnosis not present

## 2024-01-01 DIAGNOSIS — F3112 Bipolar disorder, current episode manic without psychotic features, moderate: Secondary | ICD-10-CM | POA: Diagnosis not present

## 2024-01-01 DIAGNOSIS — F17210 Nicotine dependence, cigarettes, uncomplicated: Secondary | ICD-10-CM | POA: Diagnosis not present

## 2024-01-01 DIAGNOSIS — F1010 Alcohol abuse, uncomplicated: Secondary | ICD-10-CM | POA: Diagnosis not present

## 2024-01-01 LAB — LIPID PANEL
Cholesterol: 142 mg/dL (ref 0–200)
HDL: 64 mg/dL (ref >40)
LDL Cholesterol: 72 mg/dL (ref 0–99)
Total CHOL/HDL Ratio: 2.2 {ratio}
Triglycerides: 32 mg/dL (ref <150)
VLDL: 6 mg/dL (ref 0–40)

## 2024-01-01 MED ORDER — NICOTINE 21 MG/24HR TD PT24: 21.0000 mg | MEDICATED_PATCH | Freq: Every day | TRANSDERMAL | Status: DC

## 2024-01-01 MED ORDER — RISPERIDONE 2 MG PO TABS: 2.0000 mg | ORAL_TABLET | Freq: Two times a day (BID) | ORAL | Status: AC

## 2024-01-01 MED ORDER — ADULT MULTIVITAMIN W/MINERALS CH: 1.0000 | ORAL_TABLET | Freq: Every day | ORAL | Status: AC

## 2024-01-01 MED ORDER — VITAMIN B-1 100 MG PO TABS: 100.0000 mg | ORAL_TABLET | Freq: Every day | ORAL | Status: AC

## 2024-01-01 NOTE — ED Notes (Signed)
Pt resting in bed at the current c eyes closed. Resident denies HI, SI, & AVH. Denies pain or any discomfort at this time. No s/s of acute distress observed. VSS. Safety maintained. Will continue to monitor and report any COC.

## 2024-01-01 NOTE — ED Notes (Signed)
[facility]iff here to transport to [facility]. A/o, & ambulatory. Safety maintained.
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
## 2024-01-01 NOTE — ED Notes (Signed)
Report called to Christain Sacramento RN @ 0930 @ [facility]. Sheriff called to say they were 10-15 mins out. Pt notified. Will continue to monitor for safety and report any COC.

## 2024-01-01 NOTE — ED Notes (Signed)
Pt is currently sleeping, no distress noted, environmental check complete, will continue to monitor patient for safety.  

## 2024-10-30 ENCOUNTER — Ambulatory Visit (HOSPITAL_COMMUNITY)
Admission: EM | Admit: 2024-10-30 | Discharge: 2024-10-30 | Disposition: A | Discharge status: Home / Self Care | Payer: MEDICAID

## 2024-10-30 ENCOUNTER — Encounter (HOSPITAL_COMMUNITY): Payer: Self-pay | Admitting: Emergency Medicine

## 2024-10-30 DIAGNOSIS — Z202 Contact with and (suspected) exposure to infections with a predominantly sexual mode of transmission: Secondary | ICD-10-CM | POA: Insufficient documentation

## 2024-10-30 DIAGNOSIS — N898 Other specified noninflammatory disorders of vagina: Secondary | ICD-10-CM | POA: Insufficient documentation

## 2024-10-30 LAB — POCT URINE PREGNANCY: Preg Test, Ur: NEGATIVE

## 2024-10-30 MED ORDER — METRONIDAZOLE 500 MG PO TABS: 500.0000 mg | ORAL_TABLET | Freq: Two times a day (BID) | ORAL | 0 refills | Status: AC

## 2024-10-30 NOTE — ED Triage Notes (Signed)
 Pt reports that her sexual partner positive for Trich. Reports having vaginal discharge and odors for about week.

## 2024-10-30 NOTE — Discharge Instructions (Signed)
  1. Vaginal discharge (Primary) 2. STD exposure - Cervicovaginal collected in UC and sent to lab for further testing results should be available in 2 to 3 days - POCT urine pregnancy completed in UC is negative - metroNIDAZOLE  (FLAGYL ) 500 MG tablet; Take 1 tablet (500 mg total) by mouth 2 (two) times daily.  Dispense: 14 tablet; Refill: 0 - Take prescribed medication as directed if there are any abnormal findings on the cervical vaginal swab you will be contacted appropriate treatment provided  -Continue to monitor symptoms for any change in severity if there is any escalation of current symptoms or development of new symptoms follow-up in ER for further evaluation and management.

## 2024-10-30 NOTE — ED Provider Notes (Signed)
 [facility] [place]  Note:  This document was prepared using Conservation officer, historic buildings and may include unintentional dictation errors.  MRN: [id number] DOB: 06/30/1986  Subjective:   Melinda Baker is a 38 y.o. female presenting for evaluation of vaginal discharge and vaginal odor for about a week.  Patient reports that her sexual partner tested positive for trichomonas prior to the onset of her symptoms.  Patient denies any dysuria, increased urinary frequency, abdominal pain, flank pain.  Patient denies taking any over-the-counter medication prior to arrival in urgent care.  No current facility-administered medications for this encounter.  Current Outpatient Medications:    metroNIDAZOLE  (FLAGYL ) 500 MG tablet, Take 1 tablet (500 mg total) by mouth 2 (two) times daily., Disp: 14 tablet, Rfl: 0   Multiple Vitamin (MULTIVITAMIN WITH MINERALS) TABS tablet, Take 1 tablet by mouth daily., Disp: , Rfl:    risperiDONE  (RISPERDAL ) 2 MG tablet, Take 1 tablet (2 mg total) by mouth 2 (two) times daily., Disp: , Rfl:    thiamine  (VITAMIN B-1) 100 MG tablet, Take 1 tablet (100 mg total) by mouth daily., Disp: , Rfl:    No Known Allergies  Past Medical History:  Diagnosis Date   Allergy    Asthma    Bipolar 1 disorder (HCC)      Past Surgical History:  Procedure Laterality Date   DILATION AND CURETTAGE OF UTERUS      Family History  Problem Relation Age of Onset   Diabetes Mother    Stroke Mother        TIA   Diabetes Father    Cancer Paternal Aunt    Diabetes Maternal Grandmother     Social History   Tobacco Use   Smoking status: Every Day    Current packs/day: 0.50    Average packs/day: 0.5 packs/day for 5.0 years (2.5 ttl pk-yrs)    Types: Cigarettes   Smokeless tobacco: Never   Tobacco comments:    e-cig, trying to do better  Substance Use Topics   Alcohol use: Yes    Comment: once a week.   Drug use: No    Comment: hx of ectasy, marijuana use      ROS Refer to HPI for ROS details.  Objective:    Vitals: BP (!) 141/92 (BP Location: Right Arm)   Pulse 78   Temp 97.8 F (36.6 C) (Oral)   Resp 16   LMP 10/01/2024 (Exact Date)   SpO2 100%   Physical Exam Vitals and nursing note reviewed.  Constitutional:      General: She is not in acute distress.    Appearance: Normal appearance. She is well-developed. She is not ill-appearing or toxic-appearing.  HENT:     Head: Normocephalic and atraumatic.  Cardiovascular:     Rate and Rhythm: Normal rate.  Pulmonary:     Effort: Pulmonary effort is normal. No respiratory distress.  Abdominal:     Tenderness: There is no abdominal tenderness. There is no right CVA tenderness or left CVA tenderness.  Genitourinary:    Vagina: Vaginal discharge present.  Skin:    General: Skin is warm and dry.  Neurological:     General: No focal deficit present.     Mental Status: She is alert and oriented to person, place, and time.  Psychiatric:        Mood and Affect: Mood normal.        Behavior: Behavior normal.     Procedures  Results for orders placed or  performed during the hospital encounter of 10/30/24 (from the past 24 hours)  POCT urine pregnancy     Status: Normal   Collection Time: 10/30/24  4:47 PM  Result Value Ref Range   Preg Test, Ur Negative Negative    Assessment and Plan :     Discharge Instructions       1. Vaginal discharge (Primary) 2. STD exposure - Cervicovaginal collected in UC and sent to lab for further testing results should be available in 2 to 3 days - POCT urine pregnancy completed in UC is negative - metroNIDAZOLE  (FLAGYL ) 500 MG tablet; Take 1 tablet (500 mg total) by mouth 2 (two) times daily.  Dispense: 14 tablet; Refill: 0 - Take prescribed medication as directed if there are any abnormal findings on the cervical vaginal swab you will be contacted appropriate treatment provided  -Continue to monitor symptoms for any change in severity  if there is any escalation of current symptoms or development of new symptoms follow-up in ER for further evaluation and management.      Racheal Mathurin B Lilliah Priego   Joelly Bolanos, [place] B, [place] 10/30/24 [id number]

## 2024-11-01 ENCOUNTER — Ambulatory Visit (HOSPITAL_COMMUNITY): Payer: Self-pay

## 2024-11-01 LAB — CERVICOVAGINAL ANCILLARY ONLY
Bacterial Vaginitis (gardnerella): POSITIVE — AB
Candida Glabrata: NEGATIVE
Candida Vaginitis: NEGATIVE
Chlamydia: NEGATIVE
Comment: NEGATIVE
Comment: NEGATIVE
Comment: NEGATIVE
Comment: NEGATIVE
Comment: NEGATIVE
Comment: NORMAL
Neisseria Gonorrhea: NEGATIVE
Trichomonas: POSITIVE — AB
